# Patient Record
Sex: Male | Born: 1950 | Race: White | Hispanic: No | Marital: Married | Smoking: Former smoker
Health system: Southern US, Community
[De-identification: ages and names within clinical notes are randomized; demographics above are authoritative.]

## PROBLEM LIST (undated history)

## (undated) DIAGNOSIS — Z86010 Personal history of colonic polyps: Secondary | ICD-10-CM

## (undated) DIAGNOSIS — N3010 Interstitial cystitis (chronic) without hematuria: Secondary | ICD-10-CM

## (undated) DIAGNOSIS — K5730 Diverticulosis of large intestine without perforation or abscess without bleeding: Secondary | ICD-10-CM

## (undated) DIAGNOSIS — Z87442 Personal history of urinary calculi: Secondary | ICD-10-CM

## (undated) DIAGNOSIS — Z973 Presence of spectacles and contact lenses: Secondary | ICD-10-CM

## (undated) DIAGNOSIS — M5030 Other cervical disc degeneration, unspecified cervical region: Secondary | ICD-10-CM

## (undated) DIAGNOSIS — I77811 Abdominal aortic ectasia: Secondary | ICD-10-CM

## (undated) DIAGNOSIS — N201 Calculus of ureter: Secondary | ICD-10-CM

## (undated) DIAGNOSIS — J302 Other seasonal allergic rhinitis: Secondary | ICD-10-CM

## (undated) DIAGNOSIS — N200 Calculus of kidney: Secondary | ICD-10-CM

## (undated) DIAGNOSIS — K219 Gastro-esophageal reflux disease without esophagitis: Secondary | ICD-10-CM

## (undated) DIAGNOSIS — Z87438 Personal history of other diseases of male genital organs: Secondary | ICD-10-CM

## (undated) DIAGNOSIS — Z860101 Personal history of adenomatous and serrated colon polyps: Secondary | ICD-10-CM

## (undated) DIAGNOSIS — I10 Essential (primary) hypertension: Secondary | ICD-10-CM

## (undated) HISTORY — DX: Abdominal aortic ectasia: I77.811

## (undated) HISTORY — PX: TONSILLECTOMY: SUR1361

## (undated) HISTORY — PX: COLONOSCOPY: SHX174

## (undated) HISTORY — DX: Essential (primary) hypertension: I10

---

## 1993-05-01 HISTORY — PX: PILONIDAL CYST EXCISION: SHX744

## 2000-01-16 ENCOUNTER — Encounter (INDEPENDENT_AMBULATORY_CARE_PROVIDER_SITE_OTHER): Payer: Self-pay | Admitting: Specialist

## 2000-01-16 ENCOUNTER — Ambulatory Visit (HOSPITAL_COMMUNITY): Admission: RE | Admit: 2000-01-16 | Discharge: 2000-01-17 | Payer: Self-pay | Admitting: General Surgery

## 2000-01-16 HISTORY — PX: LAPAROSCOPIC CHOLECYSTECTOMY: SUR755

## 2000-10-24 ENCOUNTER — Encounter: Payer: Self-pay | Admitting: Internal Medicine

## 2000-10-24 ENCOUNTER — Ambulatory Visit (HOSPITAL_COMMUNITY): Admission: RE | Admit: 2000-10-24 | Discharge: 2000-10-24 | Payer: Self-pay | Admitting: Internal Medicine

## 2002-06-14 ENCOUNTER — Ambulatory Visit (HOSPITAL_COMMUNITY): Admission: RE | Admit: 2002-06-14 | Discharge: 2002-06-14 | Payer: Self-pay | Admitting: Orthopedic Surgery

## 2002-06-14 HISTORY — PX: KNEE ARTHROSCOPY: SUR90

## 2003-07-01 ENCOUNTER — Encounter: Payer: Self-pay | Admitting: Internal Medicine

## 2003-07-13 ENCOUNTER — Encounter: Admission: RE | Admit: 2003-07-13 | Discharge: 2003-07-13 | Payer: Self-pay | Admitting: Internal Medicine

## 2003-08-24 ENCOUNTER — Encounter: Admission: RE | Admit: 2003-08-24 | Discharge: 2003-08-24 | Payer: Self-pay | Admitting: Internal Medicine

## 2004-07-29 ENCOUNTER — Ambulatory Visit: Payer: Self-pay | Admitting: Internal Medicine

## 2004-08-19 ENCOUNTER — Ambulatory Visit: Payer: Self-pay | Admitting: Internal Medicine

## 2004-08-31 ENCOUNTER — Ambulatory Visit: Payer: Self-pay | Admitting: Internal Medicine

## 2005-03-16 ENCOUNTER — Ambulatory Visit: Payer: Self-pay | Admitting: Internal Medicine

## 2006-06-06 ENCOUNTER — Ambulatory Visit: Payer: Self-pay | Admitting: Internal Medicine

## 2006-06-06 LAB — CONVERTED CEMR LAB
ALT: 25 units/L (ref 0–40)
AST: 22 units/L (ref 0–37)
Albumin: 4.4 g/dL (ref 3.5–5.2)
Alkaline Phosphatase: 69 units/L (ref 39–117)
BUN: 16 mg/dL (ref 6–23)
Basophils Absolute: 0.1 10*3/uL (ref 0.0–0.1)
Basophils Relative: 1.4 % — ABNORMAL HIGH (ref 0.0–1.0)
Bilirubin, Direct: 0.1 mg/dL (ref 0.0–0.3)
CO2: 32 meq/L (ref 19–32)
Calcium: 9.6 mg/dL (ref 8.4–10.5)
Chloride: 107 meq/L (ref 96–112)
Cholesterol: 219 mg/dL (ref 0–200)
Creatinine, Ser: 1 mg/dL (ref 0.4–1.5)
Direct LDL: 140.3 mg/dL
Eosinophils Absolute: 0.4 10*3/uL (ref 0.0–0.6)
Eosinophils Relative: 5.9 % — ABNORMAL HIGH (ref 0.0–5.0)
GFR calc Af Amer: 99 mL/min
GFR calc non Af Amer: 82 mL/min
Glucose, Bld: 103 mg/dL — ABNORMAL HIGH (ref 70–99)
HCT: 43.7 % (ref 39.0–52.0)
HDL: 58.8 mg/dL (ref >39.0)
Hemoglobin: 15.2 g/dL (ref 13.0–17.0)
Hgb A1c MFr Bld: 5.4 % (ref 4.6–6.0)
Lymphocytes Relative: 20.9 % (ref 12.0–46.0)
MCHC: 34.7 g/dL (ref 30.0–36.0)
MCV: 97.8 fL (ref 78.0–100.0)
Monocytes Absolute: 0.4 10*3/uL (ref 0.2–0.7)
Monocytes Relative: 5.9 % (ref 3.0–11.0)
Neutro Abs: 5 10*3/uL (ref 1.4–7.7)
Neutrophils Relative %: 65.9 % (ref 43.0–77.0)
PSA: 0.71 ng/mL (ref 0.10–4.00)
Platelets: 215 10*3/uL (ref 150–400)
Potassium: 3.8 meq/L (ref 3.5–5.1)
RBC: 4.47 M/uL (ref 4.22–5.81)
RDW: 12.1 % (ref 11.5–14.6)
Sodium: 143 meq/L (ref 135–145)
TSH: 0.52 microintl units/mL (ref 0.35–5.50)
Total Bilirubin: 0.9 mg/dL (ref 0.3–1.2)
Total CHOL/HDL Ratio: 3.7
Total Protein: 6.8 g/dL (ref 6.0–8.3)
Triglycerides: 94 mg/dL (ref 0–149)
VLDL: 19 mg/dL (ref 0–40)
WBC: 7.5 10*3/uL (ref 4.5–10.5)

## 2006-06-18 ENCOUNTER — Encounter: Admission: RE | Admit: 2006-06-18 | Discharge: 2006-06-18 | Payer: Self-pay | Admitting: Internal Medicine

## 2008-07-24 ENCOUNTER — Ambulatory Visit: Payer: Self-pay | Admitting: Internal Medicine

## 2008-07-24 DIAGNOSIS — E785 Hyperlipidemia, unspecified: Secondary | ICD-10-CM | POA: Insufficient documentation

## 2008-07-24 DIAGNOSIS — N3010 Interstitial cystitis (chronic) without hematuria: Secondary | ICD-10-CM | POA: Insufficient documentation

## 2008-07-24 DIAGNOSIS — M5030 Other cervical disc degeneration, unspecified cervical region: Secondary | ICD-10-CM | POA: Insufficient documentation

## 2008-07-24 DIAGNOSIS — J309 Allergic rhinitis, unspecified: Secondary | ICD-10-CM | POA: Insufficient documentation

## 2008-07-24 LAB — CONVERTED CEMR LAB
Cholesterol, target level: 200 mg/dL
LDL Goal: 140 mg/dL

## 2008-07-25 ENCOUNTER — Encounter: Payer: Self-pay | Admitting: Internal Medicine

## 2008-07-27 LAB — CONVERTED CEMR LAB
ALT: 26 units/L (ref 0–53)
AST: 22 units/L (ref 0–37)
Alkaline Phosphatase: 68 units/L (ref 39–117)
Basophils Absolute: 0 10*3/uL (ref 0.0–0.1)
Basophils Relative: 1 % (ref 0–1)
Bilirubin, Direct: 0.1 mg/dL (ref 0.0–0.3)
CO2: 25 meq/L (ref 19–32)
Calcium: 9.6 mg/dL (ref 8.4–10.5)
Cholesterol: 179 mg/dL (ref 0–200)
Creatinine, Ser: 0.98 mg/dL (ref 0.40–1.50)
Eosinophils Absolute: 0.3 10*3/uL (ref 0.0–0.7)
Glucose, Bld: 92 mg/dL (ref 70–99)
MCHC: 33.1 g/dL (ref 30.0–36.0)
MCV: 94 fL (ref 78.0–100.0)
Monocytes Absolute: 0.6 10*3/uL (ref 0.1–1.0)
Monocytes Relative: 7 % (ref 3–12)
Neutro Abs: 5.3 10*3/uL (ref 1.7–7.7)
Neutrophils Relative %: 64 % (ref 43–77)
RDW: 12.5 % (ref 11.5–15.5)
Sodium: 142 meq/L (ref 135–145)
Total Bilirubin: 0.6 mg/dL (ref 0.3–1.2)
Total CHOL/HDL Ratio: 3.1

## 2008-07-28 ENCOUNTER — Encounter: Payer: Self-pay | Admitting: Internal Medicine

## 2008-07-28 DIAGNOSIS — Z87448 Personal history of other diseases of urinary system: Secondary | ICD-10-CM | POA: Insufficient documentation

## 2008-07-28 DIAGNOSIS — N390 Urinary tract infection, site not specified: Secondary | ICD-10-CM

## 2008-07-30 ENCOUNTER — Encounter (INDEPENDENT_AMBULATORY_CARE_PROVIDER_SITE_OTHER): Payer: Self-pay | Admitting: *Deleted

## 2008-07-30 ENCOUNTER — Telehealth (INDEPENDENT_AMBULATORY_CARE_PROVIDER_SITE_OTHER): Payer: Self-pay | Admitting: *Deleted

## 2008-08-06 ENCOUNTER — Encounter: Payer: Self-pay | Admitting: Internal Medicine

## 2008-09-21 ENCOUNTER — Ambulatory Visit: Payer: Self-pay | Admitting: Internal Medicine

## 2008-09-21 DIAGNOSIS — K8020 Calculus of gallbladder without cholecystitis without obstruction: Secondary | ICD-10-CM | POA: Insufficient documentation

## 2008-09-21 DIAGNOSIS — R1013 Epigastric pain: Secondary | ICD-10-CM | POA: Insufficient documentation

## 2008-09-21 LAB — CONVERTED CEMR LAB
Bilirubin Urine: NEGATIVE
Nitrite: NEGATIVE
Protein, U semiquant: NEGATIVE
Urobilinogen, UA: 0.2

## 2008-09-23 ENCOUNTER — Encounter: Admission: RE | Admit: 2008-09-23 | Discharge: 2008-09-23 | Payer: Self-pay | Admitting: Internal Medicine

## 2008-09-24 LAB — CONVERTED CEMR LAB
ALT: 29 units/L (ref 0–53)
Albumin: 4.5 g/dL (ref 3.5–5.2)
Basophils Relative: 0 % (ref 0.0–3.0)
Bilirubin, Direct: 0 mg/dL (ref 0.0–0.3)
Eosinophils Relative: 4.8 % (ref 0.0–5.0)
HCT: 42.6 % (ref 39.0–52.0)
Hemoglobin: 14.8 g/dL (ref 13.0–17.0)
Lipase: 32 units/L (ref 11.0–59.0)
Lymphs Abs: 1.5 10*3/uL (ref 0.7–4.0)
MCHC: 34.7 g/dL (ref 30.0–36.0)
MCV: 97.9 fL (ref 78.0–100.0)
Monocytes Absolute: 0.3 10*3/uL (ref 0.1–1.0)
Neutro Abs: 4.1 10*3/uL (ref 1.4–7.7)
Neutrophils Relative %: 65.6 % (ref 43.0–77.0)
RBC: 4.35 M/uL (ref 4.22–5.81)
Total Protein: 6.8 g/dL (ref 6.0–8.3)
WBC: 6.2 10*3/uL (ref 4.5–10.5)

## 2008-09-25 ENCOUNTER — Encounter (INDEPENDENT_AMBULATORY_CARE_PROVIDER_SITE_OTHER): Payer: Self-pay | Admitting: *Deleted

## 2008-09-25 ENCOUNTER — Telehealth (INDEPENDENT_AMBULATORY_CARE_PROVIDER_SITE_OTHER): Payer: Self-pay | Admitting: *Deleted

## 2008-09-29 ENCOUNTER — Ambulatory Visit: Payer: Self-pay | Admitting: Internal Medicine

## 2009-12-08 ENCOUNTER — Ambulatory Visit: Payer: Self-pay | Admitting: Internal Medicine

## 2009-12-08 DIAGNOSIS — R319 Hematuria, unspecified: Secondary | ICD-10-CM

## 2009-12-08 DIAGNOSIS — I77811 Abdominal aortic ectasia: Secondary | ICD-10-CM | POA: Insufficient documentation

## 2009-12-08 DIAGNOSIS — M25559 Pain in unspecified hip: Secondary | ICD-10-CM

## 2009-12-08 LAB — CONVERTED CEMR LAB
Ketones, urine, test strip: NEGATIVE
Nitrite: NEGATIVE
Urobilinogen, UA: 0.2

## 2009-12-09 ENCOUNTER — Ambulatory Visit: Payer: Self-pay | Admitting: Internal Medicine

## 2009-12-10 ENCOUNTER — Telehealth: Payer: Self-pay | Admitting: Internal Medicine

## 2009-12-14 ENCOUNTER — Telehealth: Payer: Self-pay | Admitting: Internal Medicine

## 2010-03-09 ENCOUNTER — Telehealth: Payer: Self-pay | Admitting: Internal Medicine

## 2010-03-10 ENCOUNTER — Telehealth (INDEPENDENT_AMBULATORY_CARE_PROVIDER_SITE_OTHER): Payer: Self-pay | Admitting: *Deleted

## 2010-05-31 NOTE — Progress Notes (Signed)
  Phone Note Other Incoming   Request: Send information Summary of Call: Patient completed a [place] medcial release form requesting copies of his records from 03-10-2005 to 03-10-2010. Patient made aware of copy service fees.  Request forwarded to [employer].

## 2010-05-31 NOTE — Progress Notes (Signed)
Summary: Rosita Fire PT STOP SMOKING  Phone Note Call from Patient   Caller: Patient Summary of Call: pt left vm that he forgot to mention at OV that he had stop smoking and would like to have this updated on his chart for insurance purposes.............Marland KitchenFelecia Deloach CMA  December 14, 2009 4:18 PM   Follow-up for Phone Call        done Follow-up by: Marga Melnick MD,  December 14, 2009 5:18 PM

## 2010-05-31 NOTE — Progress Notes (Signed)
Summary: Records request  Phone Note Call from Patient Call back at Home Phone [phone]   Summary of Call: Patient left message on triage that he would like his records for the past five years. I made him aware to come by our office or [facility] medical records and sign release. Initial call taken by: Lucious Groves CMA,  March 09, 2010 2:33 PM

## 2010-05-31 NOTE — Progress Notes (Signed)
Summary: Xray results/Request for Sleep Med  Phone Note Call from Patient Call back at Home Phone [phone]   Caller: Patient Call For: Marga Melnick MD Reason for Call: Lab or Test Results Summary of Call: Pt wants xray results Initial call taken by: Lavell Islam,  December 10, 2009 2:40 PM  Follow-up for Phone Call        Spoke with patient: No fracture; a hairline fracture may not be apparent on plain films. Orthopedic consult if no better with meds & soaking in hot bath two times a day. Hopp  Patient ok'd instruction and then mentioned he is having trouble staying asleep and would like to know if Dr.Hopper would rx something, patient ok with this being addressed on Monday Follow-up by: Shonna Chock CMA,  December 10, 2009 5:15 PM  Additional Follow-up for Phone Call Additional follow up Details #1::        Zolpidem 5 mg 1 every 3rd night as needed  #10 Additional Follow-up by: Marga Melnick MD,  December 10, 2009 5:33 PM    New/Updated Medications: ZOLPIDEM TARTRATE 5 MG TABS (ZOLPIDEM TARTRATE) 1 by mouth every 3rd night, NOT for daily use Prescriptions: ZOLPIDEM TARTRATE 5 MG TABS (ZOLPIDEM TARTRATE) 1 by mouth every 3rd night, NOT for daily use  #10 x 0   Entered by:   Shonna Chock CMA   Authorized by:   Marga Melnick MD   Signed by:   Shonna Chock CMA on 12/13/2009   Method used:   Printed then faxed to ...       [facility]  [address] [phone]* (retail)       [address]       [place], [place]  [id number]       Ph: [phone] or [phone]       Fax: [phone]   RxID:   [id number]

## 2010-05-31 NOTE — Assessment & Plan Note (Signed)
Summary: FELL AND NOW HAS HIP PAIN/KN   Vital Signs:  Patient profile:   60 year old male Weight:      167.2 pounds BMI:     23.74 Temp:     99.1 degrees F oral Pulse rate:   64 / minute Resp:     15 per minute BP sitting:   124 / 90  (left arm) Cuff size:   large  Vitals Entered By: Shonna Chock CMA (December 08, 2009 2:39 PM) CC: Patient fell x 2 ( 1st-4 weeks ago, 2nd-3 weeks ago) injured right hip, patient also thinks he passed kidney stones, Lower Extremity Joint pain   CC:  Patient fell x 2 ( 1st-4 weeks ago, 2nd-3 weeks ago) injured right hip, patient also thinks he passed kidney stones, and Lower Extremity Joint pain.  History of Present Illness: Lower Extremity Joint Pain      This is a 60 year old man who presents with Lower Extremity Joint pain 4 weeks in context of 2 falls , 4 & 3 weeks ago.  The patient reports decreased ROM( short choppy steps decreases pain), but denies swelling, redness, giving away, locking, popping, stiffness for >1 hr, and weakness.  The pain is located in the right hip.  The pain began with a fall when he slipped fly fishing 4 weeks ago, landing on hip. He  fell on hip again when  he slipped on a throw rug 1 week later.  The pain is described as dull and constant.  To date  no evaluation; Rx: Aleve w/o help.  The patient denies the following symptoms: fever, rash, photosensitivity, eye symptoms, diarrhea, and dysuria.    Allergies (verified): 1)  ! 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 Eyes:  Denies discharge, eye pain, red eye, and vision loss-both eyes. GU:  Denies discharge and hematuria; Urgency , dysuria & frequency last week until there was acute resolution  with sudden increase in flow... "same as with kidney stone" PMH of renal calculi X1. Derm:  Denies lesion(s) and rash. Heme:  Denies abnormal bruising and bleeding.  Physical Exam  General:  in no acute distress; alert,appropriate and cooperative throughout examination, but standing due to pain Eyes:  No corneal or conjunctival inflammation noted. Abdomen:  Bowel sounds positive,abdomen soft and non-tender without masses, organomegaly or hernias noted.Clinically aorta is enlarged Genitalia:  Testes bilaterally descended without nodularity, tenderness or masses. No scrotal masses or lesions. No penis lesions or urethral discharge. Msk:  No deformity or scoliosis noted of thoracic or lumbar spine.   Extremities:  No clubbing, cyanosis, edema, or deformity noted with normal full range of motion of all joints.   Slight pain to direct percussion R hip / lateral upper thigh.  Neurologic:  alert & oriented X3, strength normal in all extremities, gait normal, and DTRs symmetrical and normal.   Skin:  Intact without suspicious lesions or rashes Cervical Nodes:  No lymphadenopathy noted Axillary Nodes:  No palpable lymphadenopathy Inguinal Nodes:  No significant adenopathy Psych:  memory intact for recent and remote, normally interactive, and good eye contact.     Impression & Recommendations:  Problem # 1:  HIP PAIN, RIGHT (ICD-719.45)  post trauma X 2 ; pain X 4  weeks  His updated medication list for this problem includes:    Tramadol Hcl 50 Mg Tabs (Tramadol hcl) .Marland Kitchen... 1 every 6 hrs as needed for pain  Orders: T-Hip Comp Right Min 2 views (73510TC)  Problem # 2:  HEMATURIA (ICD-599.70) Microscopic; probable renal calculi passage last week The following medications were removed from the medication list:    Trimethoprim 100 Mg Tabs (Trimethoprim) .Marland Kitchen... 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 1 two times a day pre meals 2)  Tramadol Hcl 50 Mg Tabs (Tramadol hcl) .Marland Kitchen.. 1 every 6 hrs as needed for pain  Other Orders: Specimen Handling (84696) T-Culture, Urine (29528-41324) UA Dipstick w/o Micro (manual) (81002) Prescriptions: TRAMADOL HCL 50 MG TABS (TRAMADOL HCL) 1 every 6 hrs as needed for pain  #30 x 1   Entered and Authorized by:   Marga Melnick MD   Signed by:   Marga Melnick MD on 12/08/2009   Method used:   [facility]ed to ...       [facility]  [address] [phone]* (retail)       [address]       [place], [place]  [id number]       Ph: [phone] or [phone]       Fax: [phone]   RxID:   [id number]   Laboratory Results   Urine Tests    Routine Urinalysis   Color: lt. yellow Appearance: Clear Glucose: negative   (Normal Range: Negative) Bilirubin: negative   (Normal Range: Negative) Ketone: negative   (Normal Range: Negative) Spec. Gravity: <1.005   (Normal Range: 1.003-1.035) Blood: moderate   (Normal Range: Negative) pH: 6.5   (Normal Range: 5.0-8.0) Protein: negative   (Normal Range: Negative) Urobilinogen: 0.2   (Normal Range: 0-1) Nitrite: negative   (Normal Range: Negative) Leukocyte Esterace: moderate   (Normal Range: Negative)    Comments: sent for culture

## 2010-07-26 ENCOUNTER — Encounter: Payer: Self-pay | Admitting: Internal Medicine

## 2010-07-27 ENCOUNTER — Encounter: Payer: Self-pay | Admitting: Internal Medicine

## 2010-07-27 ENCOUNTER — Ambulatory Visit (INDEPENDENT_AMBULATORY_CARE_PROVIDER_SITE_OTHER): Payer: BC Managed Care – PPO | Admitting: Internal Medicine

## 2010-07-27 DIAGNOSIS — H1010 Acute atopic conjunctivitis, unspecified eye: Secondary | ICD-10-CM

## 2010-07-27 MED ORDER — FLUTICASONE PROPIONATE 50 MCG/ACT NA SUSP: 1.0000 | Freq: Two times a day (BID) | NASAL | Status: DC

## 2010-07-27 NOTE — Progress Notes (Signed)
  Subjective:    Patient ID: Timothy [name], male    DOB: [birth year], 60 y.o.   MRN: [id number]  HPI  He presents for assessment of cervical lymphadenopathy noted by his dentist on exam last week.  Over several months he's had intermittent recurrent symptoms. These include frontal headache and nasal congestion. He's also had intermittent itchy eyes and sneezing. He has had some green secretions from his nose but not within  the last 2 weeks.  He intermittently has  some pressure discomfort in his ears, but  no discharge.  At this time he denies facial pain, dental pain, sore throat, shortness of breath, or wheezing. He denies any productive cough.  He's been using a natural  supplement with some benefit. In the past he used a nasal spray from a health food store; this was stopped when he developed some epistaxis.    Review of Systems     Objective:   Physical Exam  He appears  well nourished and healthy.  Polypoid changes are noted in the left nare. Otic canals are normal; tympanic membranes reveal no erythema or effusion.  He has mild asymmetric cervical lymphadenopathy, greater on the left than the right.  There is a suggestion of an external  nasal crease.  Chest is clear auscultation with no wheezing or rhonchi.        Assessment & Plan:  #1 extrinsic rhinoconjunctivitis is suggested; pathologic cervical lymphadenopathy is not suggested.  Plan: Neti pot  daily or twice daily would be recommended on an as needed basis. Additionally over-the-counter loratadine 10 mg daily as needed   Generic Flonase one spray twice a day as needed is also an option.

## 2010-07-27 NOTE — Patient Instructions (Signed)
Use the Neti pot daily to twice a day as needed for congestion, go from the open side to closed side. Use the generic Flonase one spray twice a day as needed, using the crossover technique discussed.

## 2010-08-15 ENCOUNTER — Ambulatory Visit (INDEPENDENT_AMBULATORY_CARE_PROVIDER_SITE_OTHER): Payer: BC Managed Care – PPO | Admitting: Internal Medicine

## 2010-08-15 ENCOUNTER — Encounter: Payer: Self-pay | Admitting: Internal Medicine

## 2010-08-15 VITALS — BP 138/90 | HR 80 | Temp 98.2°F | Wt 176.6 lb

## 2010-08-15 DIAGNOSIS — R1013 Epigastric pain: Secondary | ICD-10-CM

## 2010-08-15 DIAGNOSIS — T50905A Adverse effect of unspecified drugs, medicaments and biological substances, initial encounter: Secondary | ICD-10-CM

## 2010-08-15 DIAGNOSIS — T50995A Adverse effect of other drugs, medicaments and biological substances, initial encounter: Secondary | ICD-10-CM | POA: Insufficient documentation

## 2010-08-15 DIAGNOSIS — Z888 Allergy status to other drugs, medicaments and biological substances status: Secondary | ICD-10-CM

## 2010-08-15 LAB — CBC WITH DIFFERENTIAL/PLATELET
Basophils Absolute: 0.1 10*3/uL (ref 0.0–0.1)
Eosinophils Absolute: 0.3 10*3/uL (ref 0.0–0.7)
Hemoglobin: 13.9 g/dL (ref 13.0–17.0)
Lymphocytes Relative: 14.4 % (ref 12.0–46.0)
MCHC: 34 g/dL (ref 30.0–36.0)
MCV: 95.9 fl (ref 78.0–100.0)
Monocytes Absolute: 0.6 10*3/uL (ref 0.1–1.0)
Neutro Abs: 6 10*3/uL (ref 1.4–7.7)
Neutrophils Relative %: 74.2 % (ref 43.0–77.0)
RDW: 12.9 % (ref 11.5–14.6)

## 2010-08-15 LAB — AMYLASE: Amylase: 60 U/L (ref 27–131)

## 2010-08-15 LAB — HEPATIC FUNCTION PANEL
Albumin: 4 g/dL (ref 3.5–5.2)
Alkaline Phosphatase: 93 U/L (ref 39–117)

## 2010-08-15 MED ORDER — TRAMADOL HCL 50 MG PO TABS: 50.0000 mg | ORAL_TABLET | Freq: Four times a day (QID) | ORAL | Status: DC | PRN

## 2010-08-15 NOTE — Progress Notes (Signed)
  Subjective:    Patient ID: Timothy [name], male    DOB: 09-27-1950, 60 y.o.   MRN: [id number]  HPI ABDOMINAL PAIN  Location: RUQ Onset: 2 weeks ago, intermittently since   Radiation: somewhat to R lateral abdomen Severity: up to a 10 Quality: sharp  Duration: hours Better with:heating pad, direct pressure RUQ  &  Aleve; Pepto Bismol  & TUMS did not. He is on ranitidine 150 mg daily.  Worse with: waist flexion or lifting; large meals   Symptoms Nausea/Vomiting: no  Diarrhea: yes, loose last week  Constipation: yes, for 2 days after loose stool  Melena/BRBPR: no  Hematemesis: no  Anorexia: no  Fever/Chills: no  Dysuria: no  Rash: no  Wt loss: no  EtOH use: no  NSAIDs/ASA: yes, Aleve 2 daily on average, but up to 4/ day   Past Surgeries: Gall bladder resection  ; Diverticulosis @colonoscopy  2005. His mother had colitis, ? Ulcerative      Review of Systems no pyuria or hematuria; increased "burping " recently     Objective:   Physical Exam he is in no acute distress.  Pupils were equal round and reactive to light. There is suggestion of a subtle arcus senilis.  He has no scleral icterus  Oropharynx reveals good dental hygiene. There is minimal erythema of the posterior pharynx but no exudates.  Chest is clear to auscultation with no rales, rhonchi, or wheezing.  There's no tenderness over the sternum or inferior rib cage. Also the xiphoid is nontender.  He exhibits an S4 with slight slurring. There is no significant murmurs.  His aorta is palpable; clinically there is no evidence of an aneurysm. He is tender to palpation over the aorta but not over the right upper quadrant. There is no tenderness of upper quadrant even to percussion.  Skin is warm and dry. He has no rashes or other lesions. There is no jaundice.  All pulses are intact.  He has no significant edema, clubbing, or cyanosis.  He is oriented and interactive.          Assessment & Plan:  #1  abdominal pain  #2 past history of diverticulosis  #3 tenderness to palpation of the aorta without clinical aneurysm  #4 past medical history of cholecystectomy for gallstones  Plan: Labs will be ordered along with an ultrasound of the  abdomen. He'll be asked to complete stool cards. Increase ranitidine to twice a day before breakfast and evening meal. Tramadol will be ordered in place of the Aleve as the nonsteroidal could  induce gastritis.

## 2010-08-15 NOTE — Patient Instructions (Signed)
Complete stool cards; stop Aleve.

## 2010-08-17 ENCOUNTER — Ambulatory Visit
Admission: RE | Admit: 2010-08-17 | Discharge: 2010-08-17 | Disposition: A | Discharge status: Home / Self Care | Payer: BC Managed Care – PPO | Source: Ambulatory Visit | Attending: Internal Medicine | Admitting: Internal Medicine

## 2010-08-17 DIAGNOSIS — R1013 Epigastric pain: Secondary | ICD-10-CM

## 2010-08-18 ENCOUNTER — Other Ambulatory Visit: Payer: Self-pay | Admitting: Internal Medicine

## 2010-08-18 DIAGNOSIS — N200 Calculus of kidney: Secondary | ICD-10-CM

## 2010-08-26 ENCOUNTER — Other Ambulatory Visit: Payer: Self-pay | Admitting: *Deleted

## 2010-08-26 DIAGNOSIS — R1013 Epigastric pain: Secondary | ICD-10-CM

## 2010-08-26 MED ORDER — TRAMADOL HCL 50 MG PO TABS: 50.0000 mg | ORAL_TABLET | Freq: Four times a day (QID) | ORAL | Status: DC | PRN

## 2010-09-01 ENCOUNTER — Other Ambulatory Visit (INDEPENDENT_AMBULATORY_CARE_PROVIDER_SITE_OTHER): Payer: BC Managed Care – PPO

## 2010-09-01 ENCOUNTER — Ambulatory Visit (HOSPITAL_COMMUNITY): Admission: RE | Admit: 2010-09-01 | Payer: BC Managed Care – PPO | Source: Ambulatory Visit | Admitting: Urology

## 2010-09-01 DIAGNOSIS — Z1211 Encounter for screening for malignant neoplasm of colon: Secondary | ICD-10-CM

## 2010-09-01 LAB — HEMOCCULT GUIAC POC 1CARD (OFFICE)
Card #3 Fecal Occult Blood, POC: NEGATIVE
Fecal Occult Blood, POC: NEGATIVE

## 2010-09-16 NOTE — Op Note (Signed)
NAME:  Timothy [name], Timothy [name]                         ACCOUNT NO.:  [id number]   MEDICAL RECORD NO.:  [id number]                   PATIENT TYPE:  AMB   LOCATION:  DAY                                  FACILITY:  [facility]   PHYSICIAN:  Marlowe Kays, M.D.               DATE OF BIRTH:  1950/12/11   DATE OF PROCEDURE:  06/14/2002  DATE OF DISCHARGE:                                 OPERATIVE REPORT   PREOPERATIVE DIAGNOSES:  Bucket-handle tear of medial meniscus.   POSTOPERATIVE DIAGNOSES:  1. Double bucket-handle tear of the medial meniscus.  2. Torn lateral meniscus.  3. 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   OPERATION:  Right knee arthroscopy with 1) partial medial lateral  meniscectomy, 2) debridement of facet patella.   SURGEON:  Marlowe Kays, M.D.   ASSISTANT:  Nurse.   ANESTHESIA:  General.   PATHOLOGY AND JUSTIFICATION FOR PROCEDURE:  He has had a history of popping  and feeling that the knee is dislocated for several most now, no definite  known injury. MRI has demonstrated a bucket-handle tear of the medial  meniscus. Accordingly he is here today for the above mentioned surgery. See  operative description below.   DESCRIPTION OF PROCEDURE:  Satisfactory general anesthesia, pneumatic  tourniquet, thigh stabilizer, right knee was prepped and draped in a sterile  field, superomedial saline inflow. First through an anterior lateral portal,  the medial compartment of the knee joint was evaluated. The major bucket-  handle tear was found in __________ in the joint with some minimal wear of  the medial femoral condyle. With scissors, I cut the bucket-handle  attachment in the mid medial meniscus and through an accessory portal to the  patellar tendon. I introduced scissors and with pituitary holding on to the  cut end of the bucket-handle through the original portal, I sectioned the  posterior attachment of the meniscus in the intercondylar area and then was  able to remove the fragment which I saved for the patient. I gently smoothed  down the medial femoral condyle with a 3.5 shaver as well as performing a  modest synovectomy anteriorly. I then looked posteriorly and found a second  small bucket-handle fragment over the remaining medial meniscus and I  sectioned this out with small baskets and also removed a remnant of the  intercondylar attachment and then shaved everything down with a 3.5 shaver  with final pictures being taken. I then looked up in the medial gutter and  suprapatellar area finding the wear of the lateral facet of the patella  which was better handled through reverse portals. I then reversed portals,  his ACL was intact, a little bit of synovitis, anterolaterally I resected  for better visualization. I was able to shave down the lateral facet of the  patella until smooth. His lateral meniscus had at least three areas of  moderate tear with one in the  intercondylar area, one just prior to the  posterior curve and one at the mid third. All of this was pictured and  shaved down until smooth with a 3.5 shaver. The knee joint was then  irrigated until clear and all fluid possible removed. The three anterior  portals were closed with 4-0 nylon, 20 mL of 0.5% Marcaine with adrenaline  was instilled through an in flap rasp which  was removed and this portal closed with 4-0 nylon as well. Betadine Adaptic  dry sterile dressing were applied, tourniquet was released, tolerated the  procedure well and was taken to the recovery room in satisfactory condition  with no known complications.                                               Marlowe Kays, M.D.    JA/MEDQ  D:  06/14/2002  T:  06/14/2002  Job:  [id number]

## 2010-09-16 NOTE — Op Note (Signed)
[place]  Patient:    Timothy [name], Timothy [name]                      MRN: [id number] Proc. Date: 01/16/00 Adm. Date:  [id number] Disc. Date: [id number] Attending:  Tempie Donning CC:         Titus Dubin. Alwyn Ren, M.D. [place]   Operative Report  PREOPERATIVE DIAGNOSIS:  Cholecystitis, cholelithiasis.  POSTOPERATIVE DIAGNOSIS:  Cholecystitis, Cholelithiasis.  OPERATION PERFORMED:  Laparoscopic cholecystectomy.  SURGEON:  Gita Kudo, M.D.  ANESTHESIA:  General endotracheal.  INDICATIONS FOR PROCEDURE:  The patient is a 60 year old male with bouts of abdominal pain showing an ultrasound with gallstones.  Liver function studies were normal.  His general health is excellent and physical exam is negative otherwise.  OPERATIVE FINDINGS:  The patients gallbladder was unusual in that it was quite large and very thick.  It was adherent to the falciform ligament in the midline.  The cystic duct and artery were each small and normal in anatomy and size.  DESCRIPTION OF PROCEDURE:  Under satisfactory general endotracheal anesthesia, the patients abdomen was prepped and draped in a standard fashion.  A transverse incision was made above the umbilicus and the midline opened into the peritoneum, controlled with a figure-of-eight 0 Vicryl suture and operating Hasson port inserted and secured.  Good CO2 pneumoperitoneum establisehd and camera placed.  Then under direct vision, two #5 ports were placed laterally and a second #10 port medially.  Operating through the medial port, I dissected the gallbladder away from the falciform ligament using the J-hook for coagulation and dissection.  Following this, dissectors were used to carefully identify the cystic duct gallbladder junction and circumferentially dissect it using right angle clamps.  When we were certain of the anatomy, the duct was controlled with multiple metal clips and divided between the distal two.   The cystic artery was likewise dissected clipped and cut.  Another posterior branch of the artery was identified prior and likewise clipped and cut.  Then the gallbladder was dissected from the liver bed using the coagulating spatula for hemostasis and dissection.  The liver bed was made dry by cautery and the operative site lavaged with saline and suctioned.  Following this, the camera was moved to the upper port and the grasper placed through the umbilical port and the gallbladder removed.  Because it was so thick and large, it was opened externally and multiple small stones were removed.  The fascia and skin incisions were extended and the gallbladder extracted without spillage or complication.  It was noted to be quite thick and had many small stones.  Following this, the sites were infiltrated with Marcaine for postoperative analgesia and the midline closed with two figure-of-eight 0 Vicryl suture and subcutaneous in all areas with 4-0 Vicryl and then Steri-Strips for skin.  The sponge and needle counts were correct. There were no complications and he went to the recovery room from the operating room in good condition. DD:  01/16/00 TD:  01/17/00 Job: [id number]

## 2010-10-28 IMAGING — CR DG HIP COMPLETE 2+V*R*
3 series · 3 of 3 positions shown · non-contrast
Comparison: None.

CLINICAL DATA: Status post fall 2 weeks ago.  Pain with
weightbearing.

RIGHT HIP - COMPLETE 2+ VIEW

[view not recorded (1 of 3)]
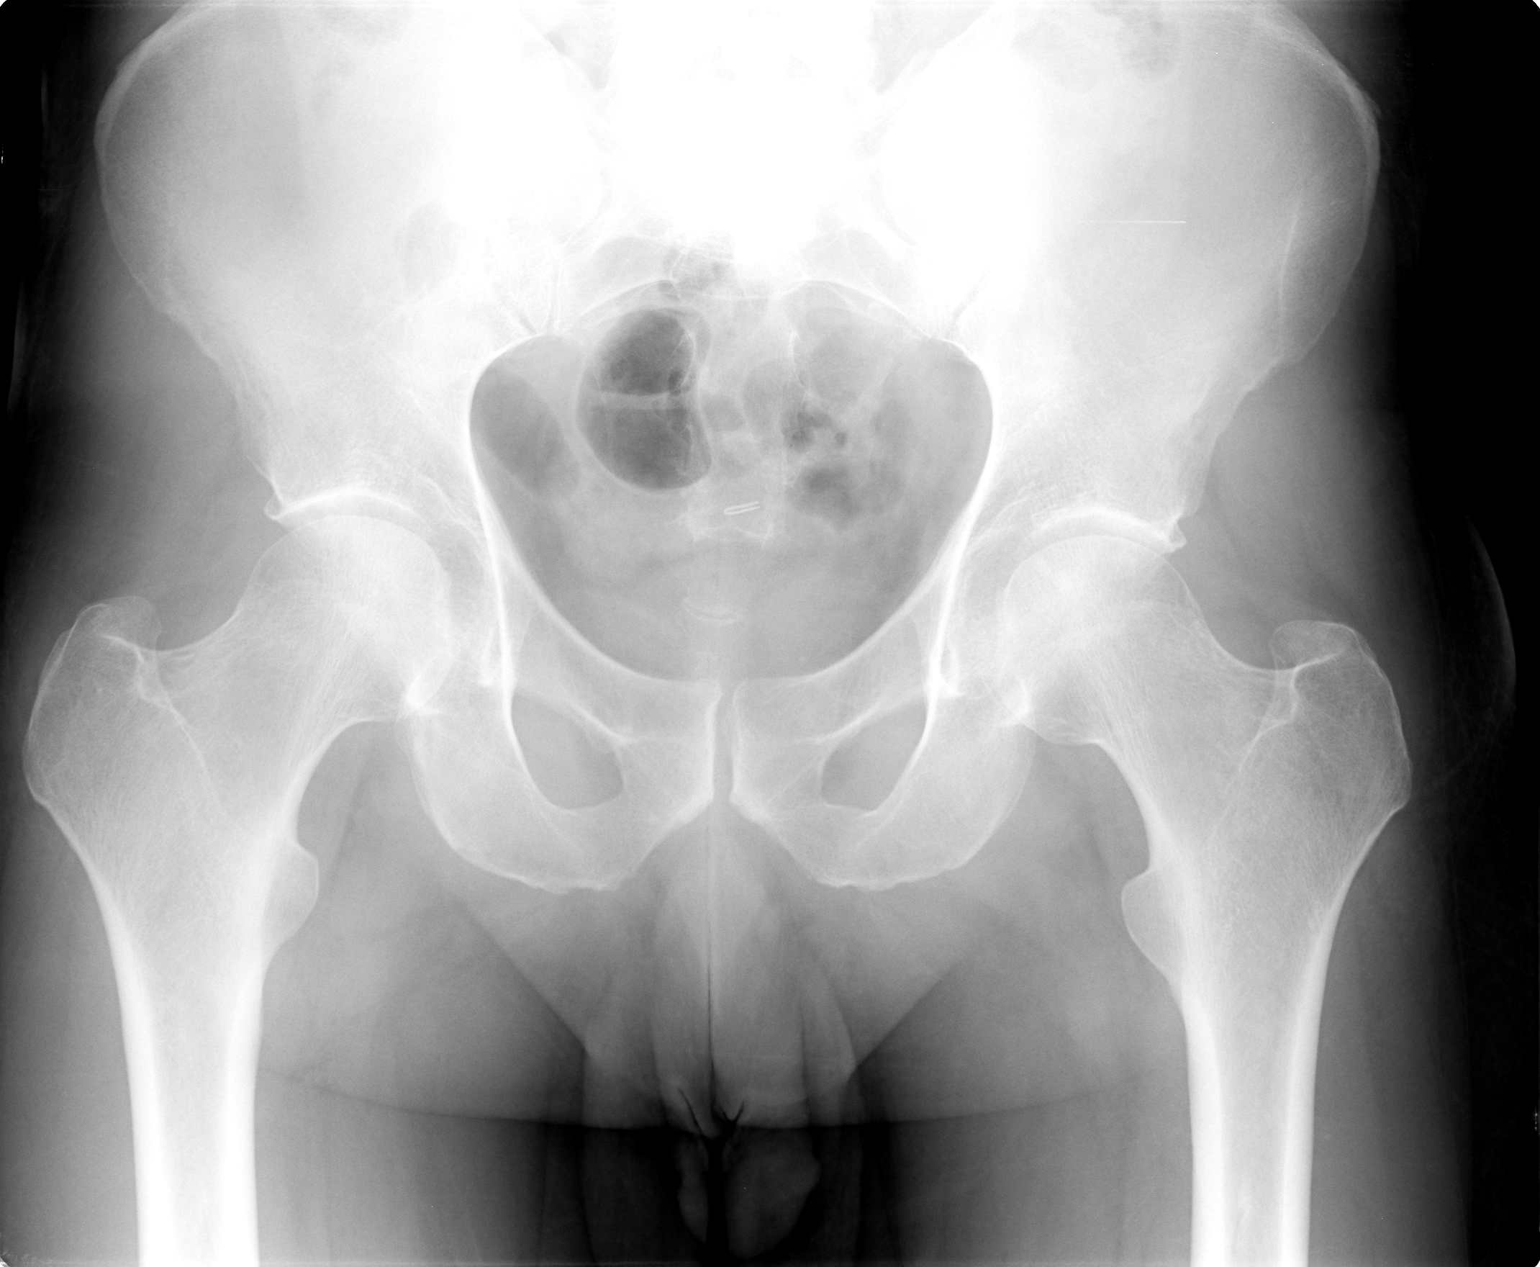

[view not recorded (2 of 3)]
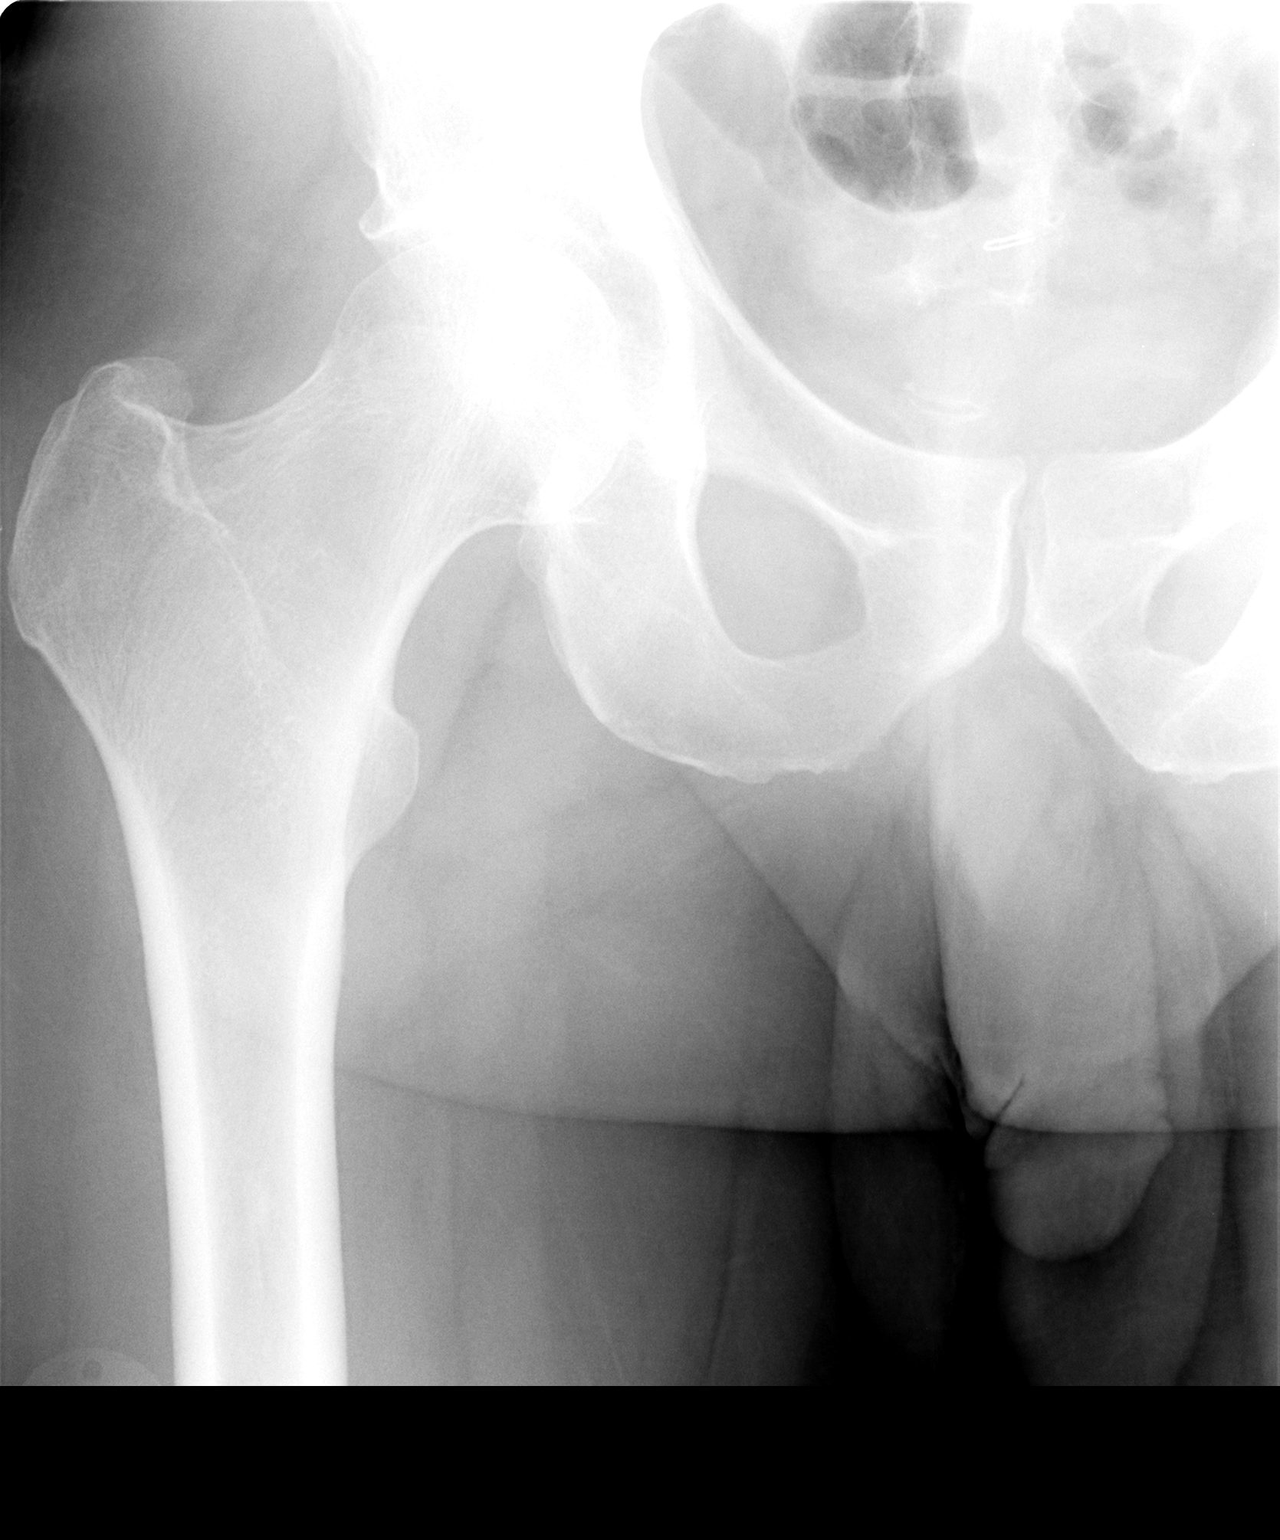

[view not recorded (3 of 3)]
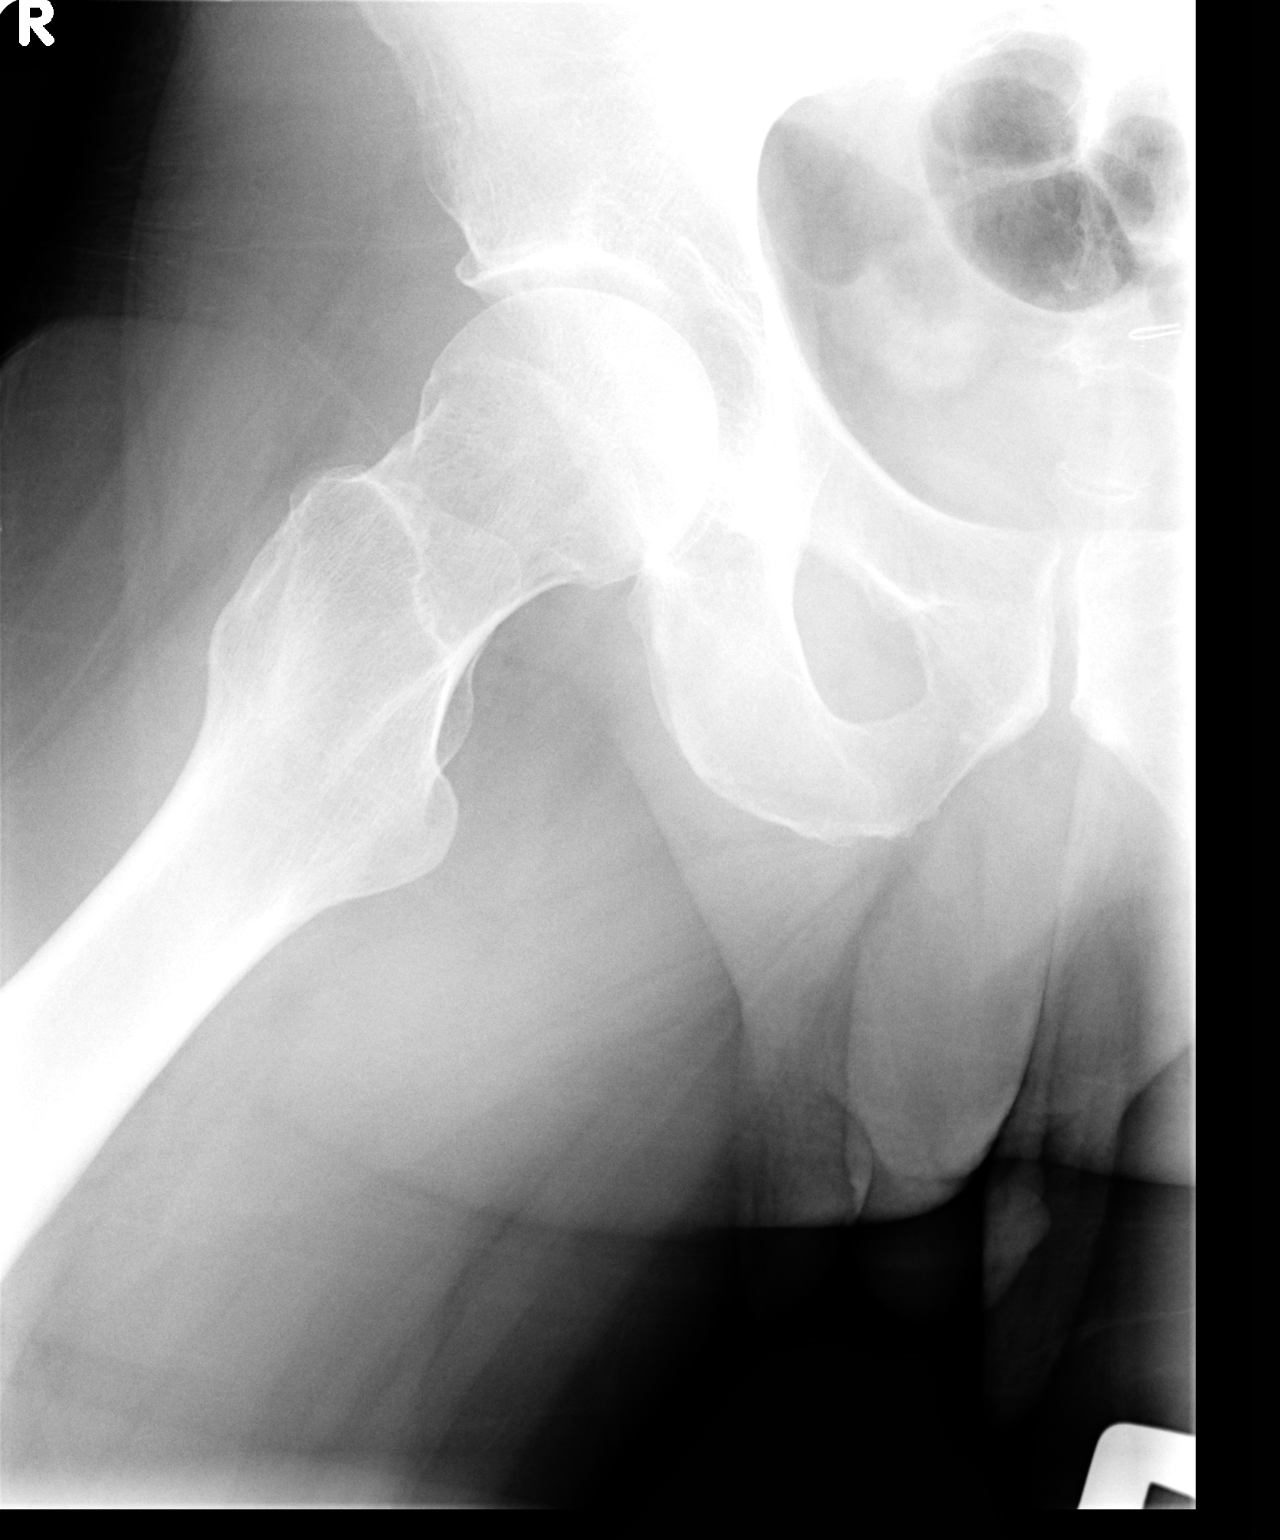

[3 of 3 positions shown; findings below may reference images not displayed]

FINDINGS: No fracture or dislocation.
IMPRESSION: No fracture.

## 2010-11-23 ENCOUNTER — Telehealth: Payer: Self-pay | Admitting: Internal Medicine

## 2010-11-23 DIAGNOSIS — I714 Abdominal aortic aneurysm, without rupture: Secondary | ICD-10-CM

## 2010-11-23 NOTE — Telephone Encounter (Signed)
error 

## 2010-11-23 NOTE — Telephone Encounter (Signed)
Patient received a note from dr hopper - (see lab [phone]) dr hopper recomed  cardiovascular a

## 2010-12-15 ENCOUNTER — Encounter: Payer: Self-pay | Admitting: Cardiology

## 2010-12-15 ENCOUNTER — Ambulatory Visit (INDEPENDENT_AMBULATORY_CARE_PROVIDER_SITE_OTHER): Payer: BC Managed Care – PPO | Admitting: Cardiology

## 2010-12-15 DIAGNOSIS — R030 Elevated blood-pressure reading, without diagnosis of hypertension: Secondary | ICD-10-CM

## 2010-12-15 DIAGNOSIS — I714 Abdominal aortic aneurysm, without rupture, unspecified: Secondary | ICD-10-CM | POA: Insufficient documentation

## 2010-12-15 DIAGNOSIS — E785 Hyperlipidemia, unspecified: Secondary | ICD-10-CM

## 2010-12-15 DIAGNOSIS — I517 Cardiomegaly: Secondary | ICD-10-CM

## 2010-12-15 NOTE — Assessment & Plan Note (Signed)
Patient's blood pressure is elevated in the office today. I have asked him to check this at home. He will keep records and if hypertension demonstrated we will add medications particularly with his recently diagnosed small aneurysm.

## 2010-12-15 NOTE — Assessment & Plan Note (Signed)
Plan repeat abdominal ultrasound April 2013.

## 2010-12-15 NOTE — Patient Instructions (Signed)
Your physician recommends that you schedule a follow-up appointment in: 6 WEEKS  Your physician has requested that you have an echocardiogram. Echocardiography is a painless test that uses sound waves to create images of your heart. It provides your doctor with information about the size and shape of your heart and how well your heart's chambers and valves are working. This procedure takes approximately one hour. There are no restrictions for this procedure.   START ASPIRIN 81MG  ONCE DAILY

## 2010-12-15 NOTE — Progress Notes (Signed)
HPI: 60 year old male for evaluation of abdominal aortic aneurysm. No prior cardiac history. He typically does not have dyspnea on exertion, orthopnea, PND, pedal edema, palpitations, syncope or chest pain. He had an abdominal ultrasound in April 2012 that revealed  a focal bulge in the distal abdominal aorta measuring 3 cm in greatest diameter. This is a probable early distal aortic aneurysm. Cardiology therefore asked to evaluate.  Current Outpatient Prescriptions  Medication Sig Dispense Refill  . Doxylamine Succinate, Sleep, (SLEEP AID PO) Take by mouth at bedtime.        . naproxen (NAPROSYN) 500 MG tablet Take 500 mg by mouth 2 (two) times daily with a meal.        . ranitidine (ZANTAC) 150 MG tablet Take 150 mg by mouth 2 (two) times daily.          Allergies  Allergen Reactions  . Doxycycline Hives  . Dye Fdc Blue (Brilliant Blue Fcf (Fd&C Blue #1))   . Penicillins Hives  . Sulfa Antibiotics Rash    2006    Past Medical History  Diagnosis Date  . Diverticulosis   . Seasonal allergies   . Elevated PSA     Dr Vonita Moss    Past Surgical History  Procedure Date  . Cholecystectomy 2001    Dr. Maryagnes Amos  . Cystectomy     Pilonidal Cystectomy  . Knee arthroscopy 2001  . Cystoscopy 1999  . Colonoscopy 2005    Tics  . Tonsillectomy     History   Social History  . Marital Status: Married    Spouse Name: N/A    Number of Children: N/A  . Years of Education: N/A   Occupational History  .      Art conservation   Social History Main Topics  . Smoking status: Former Games developer  . Smokeless tobacco: Not on file  . Alcohol Use: Yes     2-3 beers/week  . Drug Use: No  . Sexually Active: Not on file   Other Topics Concern  . Not on file   Social History Narrative  . No narrative on file    Family History  Problem Relation Age of Onset  . Aneurysm Father 51    CNS aneursym  . Heart failure Mother   . Colitis Mother   . Cancer Other     ; primary unknown    ROS:  no fevers or chills, productive cough, hemoptysis, dysphasia, odynophagia, melena, hematochezia, dysuria, hematuria, rash, seizure activity, orthopnea, PND, pedal edema, claudication. Remaining systems are negative.  Physical Exam: General:  Well developed/well nourished in NAD Skin warm/dry Patient not depressed No peripheral clubbing Back-normal HEENT-normal/normal eyelids Neck supple/normal carotid upstroke bilaterally; no bruits; no JVD; no thyromegaly chest - CTA/ normal expansion CV - RRR/normal S1 and S2; no murmurs, rubs or gallops;  PMI nondisplaced Abdomen -NT/ND, no HSM, no mass, + bowel sounds, no bruit; possible small pulsatile mass. 2+ femoral pulses, no bruits Ext-no edema, chords, 2+ DP Neuro-grossly nonfocal  ECG NSR with LVH

## 2010-12-15 NOTE — Assessment & Plan Note (Signed)
Management per primary care. 

## 2010-12-15 NOTE — Assessment & Plan Note (Signed)
Will schedule echocardiogram to assess LV function and wall thickness. We'll also rule out proximal thoracic aortic aneurysm.

## 2010-12-16 ENCOUNTER — Encounter: Payer: Self-pay | Admitting: *Deleted

## 2010-12-20 ENCOUNTER — Ambulatory Visit (HOSPITAL_COMMUNITY): Payer: BC Managed Care – PPO | Attending: Internal Medicine | Admitting: Radiology

## 2010-12-20 DIAGNOSIS — I10 Essential (primary) hypertension: Secondary | ICD-10-CM | POA: Insufficient documentation

## 2010-12-20 DIAGNOSIS — I714 Abdominal aortic aneurysm, without rupture, unspecified: Secondary | ICD-10-CM

## 2010-12-20 DIAGNOSIS — E785 Hyperlipidemia, unspecified: Secondary | ICD-10-CM | POA: Insufficient documentation

## 2010-12-20 DIAGNOSIS — I517 Cardiomegaly: Secondary | ICD-10-CM

## 2010-12-20 HISTORY — PX: TRANSTHORACIC ECHOCARDIOGRAM: SHX275

## 2011-01-13 ENCOUNTER — Encounter: Payer: BC Managed Care – PPO | Admitting: Internal Medicine

## 2011-01-23 ENCOUNTER — Encounter: Payer: Self-pay | Admitting: Cardiology

## 2011-01-23 ENCOUNTER — Ambulatory Visit (INDEPENDENT_AMBULATORY_CARE_PROVIDER_SITE_OTHER): Payer: BC Managed Care – PPO | Admitting: Cardiology

## 2011-01-23 VITALS — BP 156/100 | HR 76 | Ht 70.0 in | Wt 179.0 lb

## 2011-01-23 DIAGNOSIS — I10 Essential (primary) hypertension: Secondary | ICD-10-CM | POA: Insufficient documentation

## 2011-01-23 DIAGNOSIS — I714 Abdominal aortic aneurysm, without rupture, unspecified: Secondary | ICD-10-CM

## 2011-01-23 DIAGNOSIS — E785 Hyperlipidemia, unspecified: Secondary | ICD-10-CM

## 2011-01-23 MED ORDER — LISINOPRIL 10 MG PO TABS: 10.0000 mg | ORAL_TABLET | Freq: Every day | ORAL | Status: DC

## 2011-01-23 NOTE — Assessment & Plan Note (Signed)
Blood pressure is mildly elevated. Begin lisinopril 10 mg daily. Check potassium and renal function in one week.

## 2011-01-23 NOTE — Patient Instructions (Signed)
Your physician wants you to follow-up in: 6 MONTHS You will receive a reminder letter in the mail two months in advance. If you don't receive a letter, please call our office to schedule the follow-up appointment.   START LISINOPRIL 10 MG ONCE DAILY  Your physician recommends that you return for lab work in: ONE WEEK

## 2011-01-23 NOTE — Progress Notes (Signed)
HPI:60 year old male I saw in August of 2012 for evaluation of abdominal aortic aneurysm.  He had an abdominal ultrasound in April 2012 that revealed a focal bulge in the distal abdominal aorta measuring 3 cm in greatest diameter. This is a probable early distal aortic aneurysm. Echo in August of 2012 showed normal LV function and no thoracic aneurysm. Since he was last seen he denies dyspnea, chest pain, palpitations or syncope. He did bring records of his home blood pressure. His systolic typically runs 140-145. The diastolic appears to be 90.   Current Outpatient Prescriptions  Medication Sig Dispense Refill  . aspirin EC 81 MG tablet Take 1 tablet (81 mg total) by mouth daily.      . Doxylamine Succinate, Sleep, (SLEEP AID PO) Take by mouth at bedtime.        . naproxen (NAPROSYN) 500 MG tablet Take 500 mg by mouth 2 (two) times daily with a meal.        . ranitidine (ZANTAC) 150 MG tablet Take 150 mg by mouth 2 (two) times daily.           Past Medical History  Diagnosis Date  . Diverticulosis   . Seasonal allergies   . Elevated PSA     Dr Vonita Moss  . Hypertension   . AAA (abdominal aortic aneurysm)     Past Surgical History  Procedure Date  . Cholecystectomy 2001    Dr. Maryagnes Amos  . Cystectomy     Pilonidal Cystectomy  . Knee arthroscopy 2001  . Cystoscopy 1999  . Colonoscopy 2005    Tics  . Tonsillectomy     History   Social History  . Marital Status: Married    Spouse Name: N/A    Number of Children: N/A  . Years of Education: N/A   Occupational History  .      Art conservation   Social History Main Topics  . Smoking status: Former Games developer  . Smokeless tobacco: Not on file  . Alcohol Use: Yes     2-3 beers/week  . Drug Use: No  . Sexually Active: Not on file   Other Topics Concern  . Not on file   Social History Narrative  . No narrative on file    ROS: no fevers or chills, productive cough, hemoptysis, dysphasia, odynophagia, melena, hematochezia,  dysuria, hematuria, rash, seizure activity, orthopnea, PND, pedal edema, claudication. Remaining systems are negative.  Physical Exam: Well-developed well-nourished in no acute distress.  Skin is warm and dry.  HEENT is normal.  Neck is supple. No thyromegaly.  Chest is clear to auscultation with normal expansion.  Cardiovascular exam is regular rate and rhythm.  Abdominal exam nontender or distended. No masses palpated. Extremities show no edema. neuro grossly intact

## 2011-01-23 NOTE — Assessment & Plan Note (Signed)
Repeat abdominal ultrasound April 2013.

## 2011-01-23 NOTE — Assessment & Plan Note (Signed)
Management per primary care. 

## 2011-01-31 ENCOUNTER — Other Ambulatory Visit: Payer: BC Managed Care – PPO | Admitting: *Deleted

## 2011-07-06 IMAGING — US US ABDOMEN COMPLETE
1 series · 14 of 25 positions shown · non-contrast
Comparison: 09/23/2008

CLINICAL DATA: Epigastric/abdominal pain

COMPLETE ABDOMINAL ULTRASOUND

[Series 1: us abdomen complete · 14 of 88 slices shown]
[im 1/88]
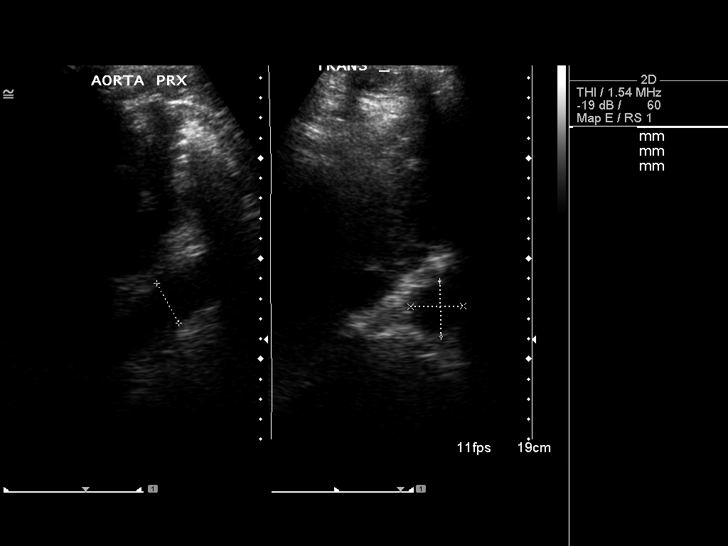
[im 8/88]
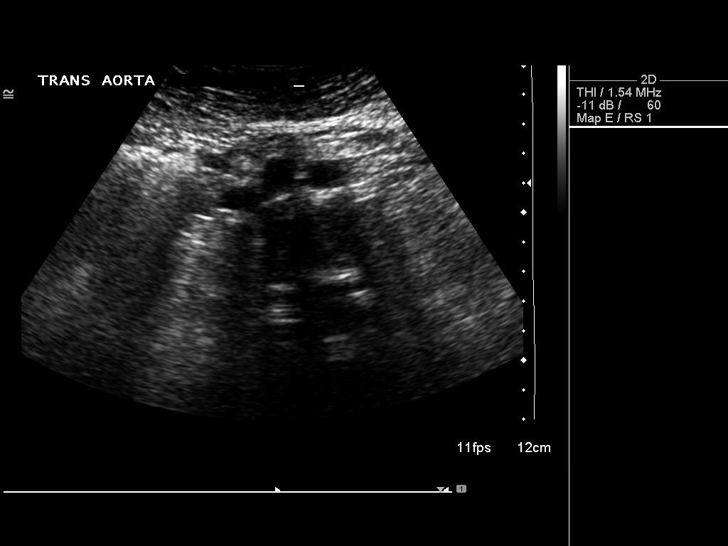
[im 15/88]
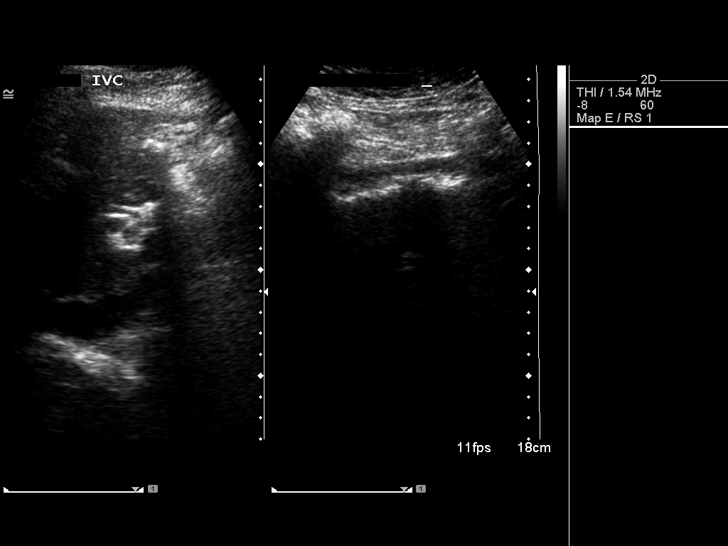
[im 22/88]
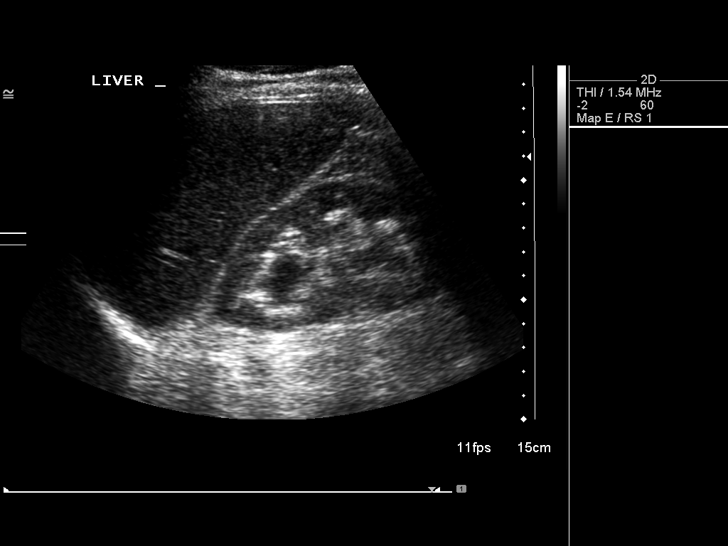
[im 30/88]
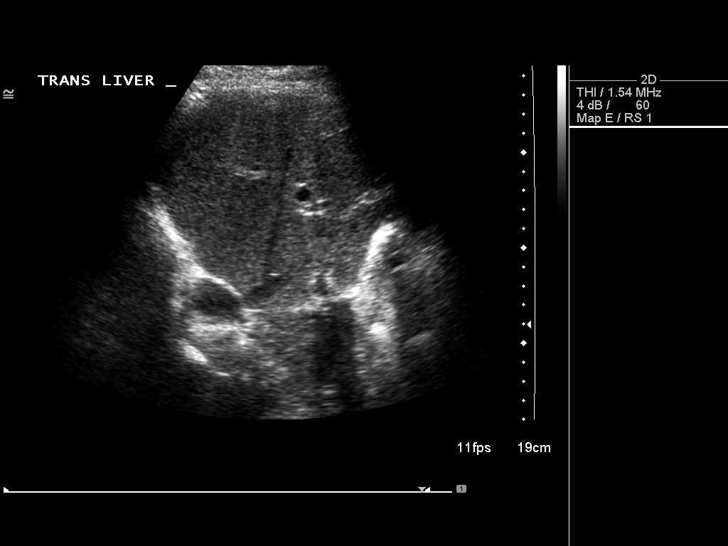
[im 33/88]
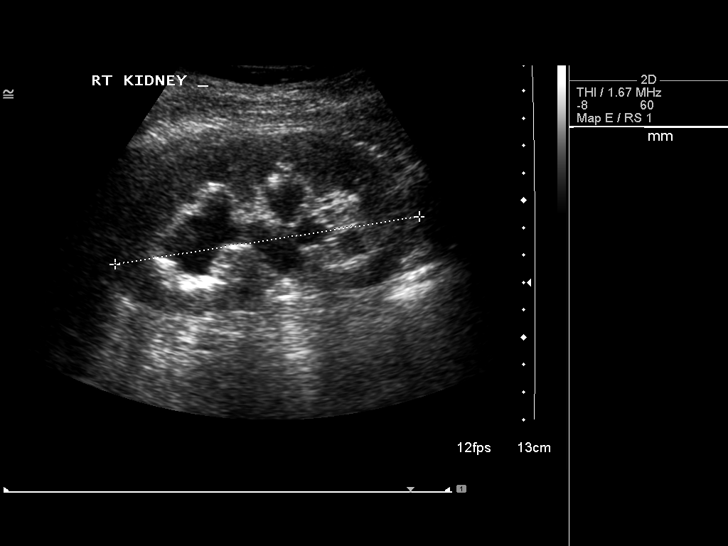
[im 40/88]
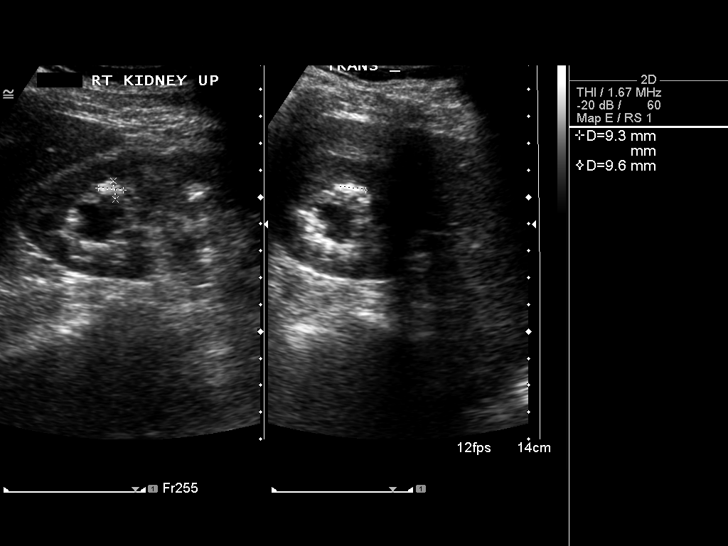
[im 48/88]
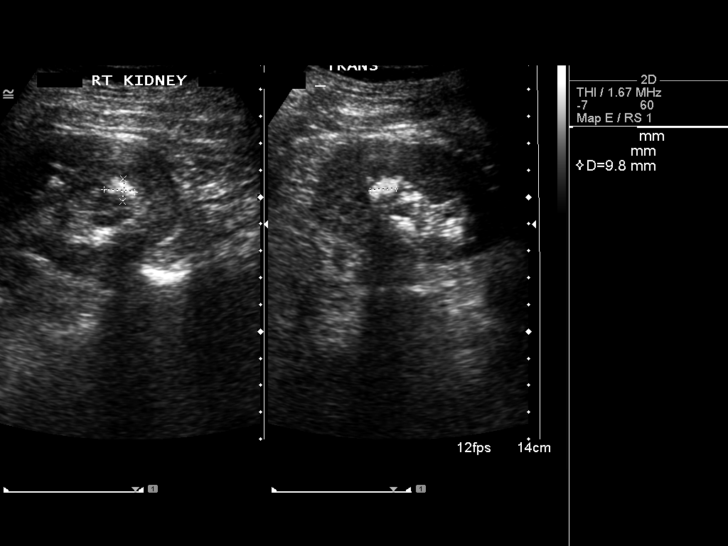
[im 55/88]
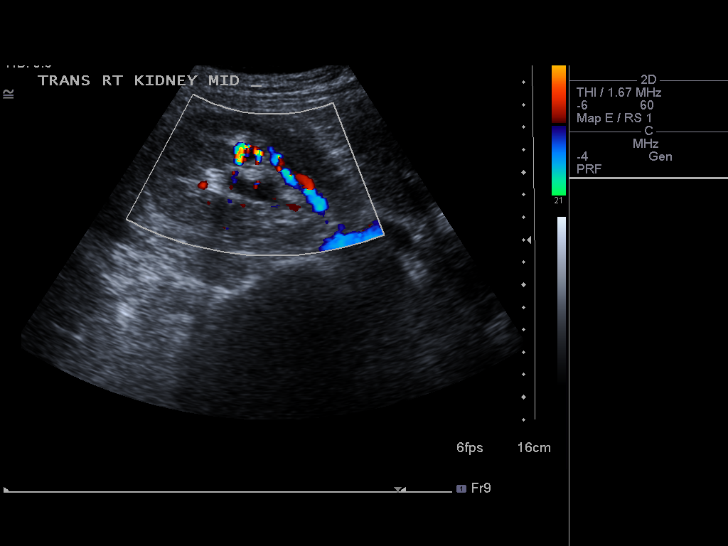
[im 59/88]
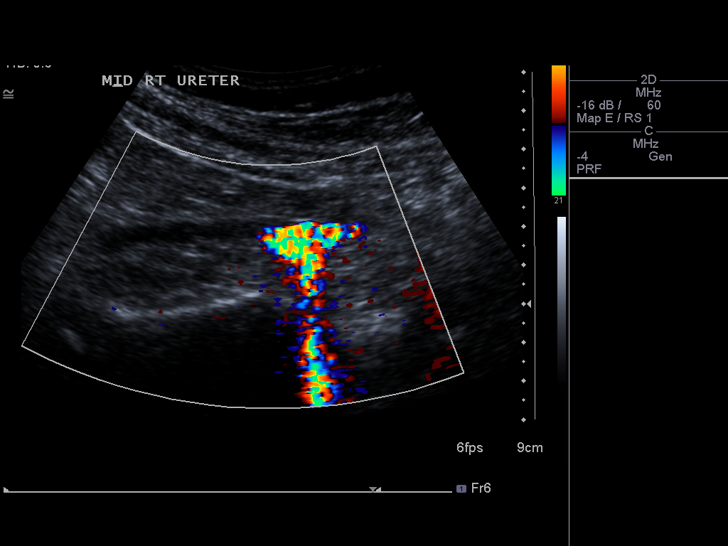
[im 66/88]
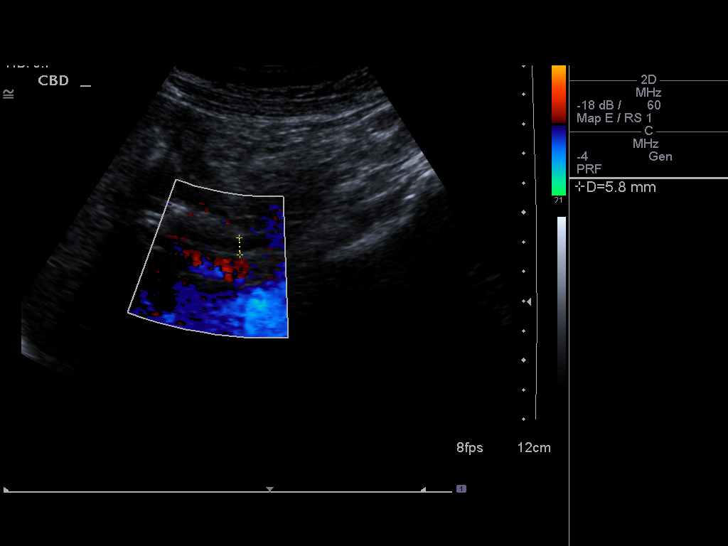
[im 73/88]
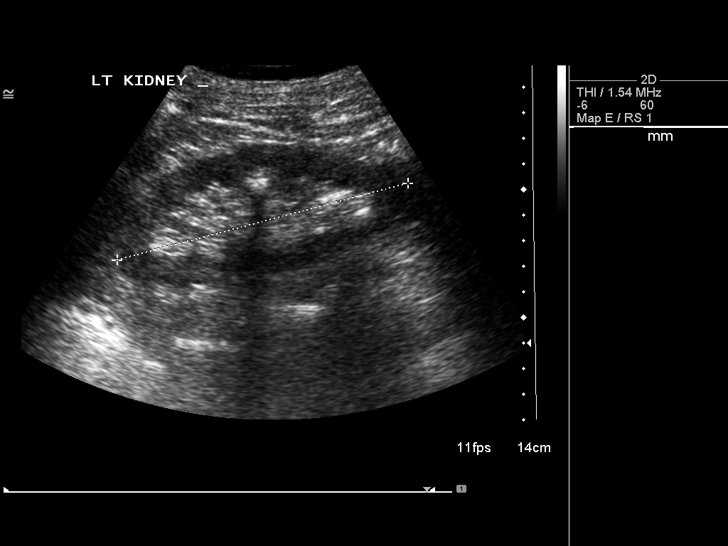
[im 80/88]
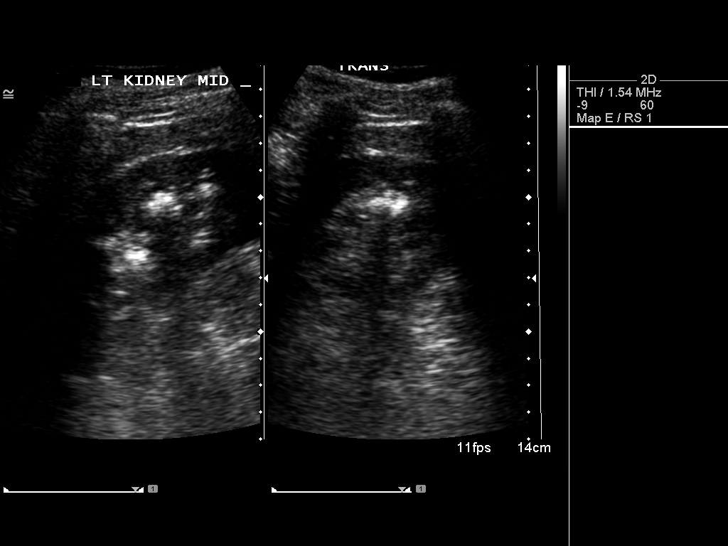
[im 88/88]
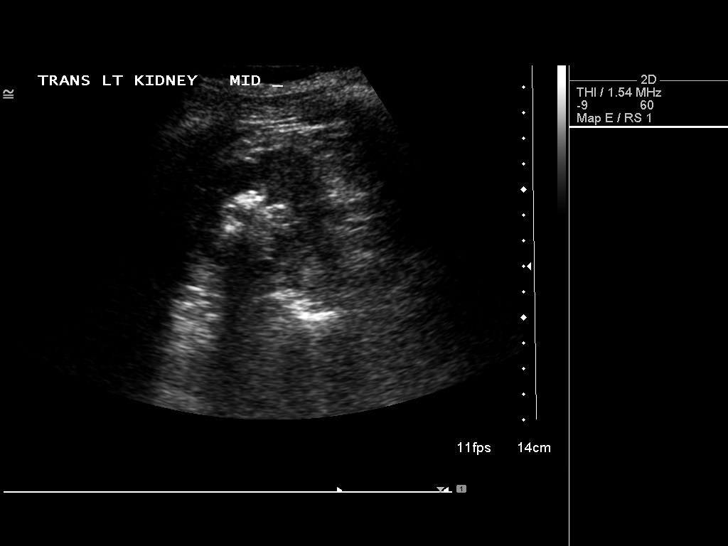

[14 of 25 positions shown; findings below may reference images not displayed]

FINDINGS: Gallbladder:  Surgically removed.

Common bile duct:  5.8 mm.

Liver:  No focal lesion identified.  Within normal limits in
parenchymal echogenicity.

IVC:  Normal

Pancreas:  Normal

Spleen:  Normal

Right Kidney:  11.2 cm in sagittal length.  There is mild to
moderate hydronephrosis which is a new finding.  There are multiple
renal calculi.  There is also a 5 mm calculus in the upper right
ureter.

Left Kidney:  11.8 cm in sagittal length.  There are multiple
calculi but no current hydronephrosis.

Abdominal aorta:  There is a focal bulge in the distal abdominal
aorta measuring 3 cm in greatest diameter.  This was not present
previously.  This is a probable early distal aortic aneurysm.
IMPRESSION: 1.  Post cholecystectomy.
2.  Bilateral renal calculi.  There is mild to moderate
hydronephrosis on the right.    There is also a 5 mm stone in the
proximal ureter.  The obstructive uropathy on the right is a new
finding.

3.  Probable small distal abdominal aortic aneurysm.  See report.

## 2011-11-17 ENCOUNTER — Ambulatory Visit: Payer: BC Managed Care – PPO | Admitting: Internal Medicine

## 2011-12-07 ENCOUNTER — Ambulatory Visit (INDEPENDENT_AMBULATORY_CARE_PROVIDER_SITE_OTHER): Payer: BC Managed Care – PPO | Admitting: Cardiovascular Disease

## 2011-12-07 ENCOUNTER — Encounter: Payer: Self-pay | Admitting: Cardiovascular Disease

## 2011-12-07 VITALS — BP 150/80 | HR 74 | Ht 69.0 in | Wt 182.0 lb

## 2011-12-07 DIAGNOSIS — R030 Elevated blood-pressure reading, without diagnosis of hypertension: Secondary | ICD-10-CM

## 2011-12-07 DIAGNOSIS — Z8679 Personal history of other diseases of the circulatory system: Secondary | ICD-10-CM

## 2011-12-07 DIAGNOSIS — Z9889 Other specified postprocedural states: Secondary | ICD-10-CM

## 2011-12-07 MED ORDER — LOSARTAN POTASSIUM 50 MG PO TABS: 50.0000 mg | ORAL_TABLET | Freq: Every day | ORAL | Status: DC

## 2011-12-07 NOTE — Assessment & Plan Note (Signed)
Change to cozaar and take in am.  F/U 4-6 weeks and recheck.

## 2011-12-07 NOTE — Assessment & Plan Note (Signed)
Cholesterol is at goal.  Continue current dose of statin and diet Rx.  No myalgias or side effects.  F/U  LFT's in 6 months. Lab Results  Component Value Date   LDLCALC 108* 07/24/2008             

## 2011-12-07 NOTE — Progress Notes (Signed)
Patient ID: Timothy [name], male   DOB: 24-Sep-1950, 61 y.o.   MRN: [id number] HPI:61 year old male seen by Dr Jens Som in 8/12 and 9/12  for evaluation of abdominal aortic aneurysm. He had an abdominal ultrasound in April 2012 that revealed a focal bulge in the distal abdominal aorta measuring 3 cm in greatest diameter. This is a probable early distal aortic aneurysm. Echo in August of 2012 showed normal LV function and no thoracic aneurysm. Since he was last seen he denies dyspnea, chest pain, palpitations or syncope. He did bring records of his home blood pressure. His systolic typically runs 140-145. The diastolic appears to be 90. He thinks the lisinopril makes him drowsy.  Takes it at night.    ROS: Denies fever, malais, weight loss, blurry vision, decreased visual acuity, cough, sputum, SOB, hemoptysis, pleuritic pain, palpitaitons, heartburn, abdominal pain, melena, lower extremity edema, claudication, or rash.  All other systems reviewed and negative  General: Affect appropriate Healthy:  appears stated age HEENT: normal Neck supple with no adenopathy JVP normal no bruits no thyromegaly Lungs clear with no wheezing and good diaphragmatic motion Heart:  S1/S2 no murmur, no rub, gallop or click PMI normal Abdomen: benighn, BS positve, no tenderness,  Palpable abdominal Ao S/P choly lap no bruit.  No HSM or HJR Distal pulses intact with no bruits No edema Neuro non-focal Skin warm and dry No muscular weakness   Current Outpatient Prescriptions  Medication Sig Dispense Refill  . aspirin EC 81 MG tablet Take 1 tablet (81 mg total) by mouth daily.      . Doxylamine Succinate, Sleep, (SLEEP AID PO) Take by mouth at bedtime.        Marland Kitchen lisinopril (PRINIVIL,ZESTRIL) 10 MG tablet Take 1 tablet (10 mg total) by mouth daily.  30 tablet  11  . naproxen (NAPROSYN) 500 MG tablet Take 500 mg by mouth 2 (two) times daily with a meal.        . ranitidine (ZANTAC) 150 MG tablet Take 150 mg by mouth 2  (two) times daily.          Allergies  Doxycycline; Dye fdc blue; Penicillins; and Sulfa antibiotics  Electrocardiogram:  NSR rate 74 normal ECG  Assessment and Plan

## 2011-12-07 NOTE — Assessment & Plan Note (Signed)
Palpable on exam  F/U duplex

## 2011-12-07 NOTE — Patient Instructions (Addendum)
Your physician has requested that you have an abdominal aorta ULTRASOUND TO BE ON SAME DAY AS FOLLOW UP APP WITH DR Eden Emms  During this test, an ultrasound is used to evaluate the aorta. Allow 30 minutes for this exam. Do not eat after midnight the day before and avoid carbonated beverages  Your physician recommends that you schedule a follow-up appointment in: 4-5 WEEKS SAME DAY TO FOLLOW U/S  Your physician has recommended you make the following change in your medication:   STOP LISINOPRIL DUE TO FATIGUE START COZAAR 50 MG DAILY- MAY TAKE MORNING OR IN EVENING

## 2012-01-04 ENCOUNTER — Encounter: Payer: Self-pay | Admitting: *Deleted

## 2012-01-05 ENCOUNTER — Encounter (INDEPENDENT_AMBULATORY_CARE_PROVIDER_SITE_OTHER): Payer: BC Managed Care – PPO

## 2012-01-05 ENCOUNTER — Encounter: Payer: Self-pay | Admitting: Cardiovascular Disease

## 2012-01-05 ENCOUNTER — Ambulatory Visit (INDEPENDENT_AMBULATORY_CARE_PROVIDER_SITE_OTHER): Payer: BC Managed Care – PPO | Admitting: Cardiovascular Disease

## 2012-01-05 VITALS — BP 140/81 | HR 50 | Ht 70.0 in | Wt 182.0 lb

## 2012-01-05 DIAGNOSIS — R030 Elevated blood-pressure reading, without diagnosis of hypertension: Secondary | ICD-10-CM

## 2012-01-05 DIAGNOSIS — Z8679 Personal history of other diseases of the circulatory system: Secondary | ICD-10-CM

## 2012-01-05 DIAGNOSIS — I714 Abdominal aortic aneurysm, without rupture: Secondary | ICD-10-CM

## 2012-01-05 DIAGNOSIS — E785 Hyperlipidemia, unspecified: Secondary | ICD-10-CM

## 2012-01-05 DIAGNOSIS — I77811 Abdominal aortic ectasia: Secondary | ICD-10-CM

## 2012-01-05 NOTE — Assessment & Plan Note (Signed)
Cholesterol is at goal.  Continue current dose of statin and diet Rx.  No myalgias or side effects.  F/U  LFT's in 6 months. Lab Results  Component Value Date   LDLCALC 108* 07/24/2008

## 2012-01-05 NOTE — Assessment & Plan Note (Signed)
Stable by duplex today less than 3.0 cm  F/U duplex in 2 years

## 2012-01-05 NOTE — Progress Notes (Signed)
Patient ID: Timothy [name], male   DOB: 03/03/1951, 61 y.o.   MRN: [id number] HPI:61 year old male seen by Dr Jens Som in 8/12 and 9/12 for evaluation of abdominal aortic aneurysm. He had an abdominal ultrasound in April 2012 that revealed a focal bulge in the distal abdominal aorta measuring 3 cm in greatest diameter. This is a probable early distal aortic aneurysm. Echo in August of 2012 showed normal LV function and no thoracic aneurysm. Since he was last seen he denies dyspnea, chest pain, palpitations or syncope. Last visit lisinopril stopped due to drowsyness and cozaar started  Tolerating this better  Has not taken BP at home    Preliminary [place] of AAA looked at and under 3.0 cm  stable   ROS: Denies fever, malais, weight loss, blurry vision, decreased visual acuity, cough, sputum, SOB, hemoptysis, pleuritic pain, palpitaitons, heartburn, abdominal pain, melena, lower extremity edema, claudication, or rash.  All other systems reviewed and negative  General: Affect appropriate Healthy:  appears stated age HEENT: normal Neck supple with no adenopathy JVP normal no bruits no thyromegaly Lungs clear with no wheezing and good diaphragmatic motion Heart:  S1/S2 no murmur, no rub, gallop or click PMI normal Abdomen: benighn, BS positve, no tenderness, abdominal Ao palpable no bruit.  No HSM or HJR Distal pulses intact with no bruits No edema Neuro non-focal Skin warm and dry No muscular weakness   Current Outpatient Prescriptions  Medication Sig Dispense Refill  . aspirin EC 81 MG tablet Take 1 tablet (81 mg total) by mouth daily.      . Doxylamine Succinate, Sleep, (SLEEP AID PO) Take by mouth at bedtime.        Marland Kitchen losartan (COZAAR) 50 MG tablet Take 1 tablet (50 mg total) by mouth daily.  30 tablet  5  . naproxen (NAPROSYN) 500 MG tablet Take 500 mg by mouth 2 (two) times daily with a meal.        . ranitidine (ZANTAC) 150 MG tablet Take 150 mg by mouth 2 (two) times daily.           Allergies  Doxycycline; Dye fdc blue; Penicillins; and Sulfa antibiotics  Electrocardiogram:  Assessment and Plan

## 2012-01-05 NOTE — Assessment & Plan Note (Signed)
Continue Cozaar He will monitor at home Increase to 100mg  if home readings are high

## 2012-01-05 NOTE — Patient Instructions (Signed)
Your physician wants you to follow-up in:  6 MONTHS WITH DR NISHAN  You will receive a reminder letter in the mail two months in advance. If you don't receive a letter, please call our office to schedule the follow-up appointment. Your physician recommends that you continue on your current medications as directed. Please refer to the Current Medication list given to you today. 

## 2012-04-12 ENCOUNTER — Encounter: Payer: Self-pay | Admitting: Internal Medicine

## 2012-04-12 ENCOUNTER — Ambulatory Visit (INDEPENDENT_AMBULATORY_CARE_PROVIDER_SITE_OTHER): Payer: BC Managed Care – PPO | Admitting: Internal Medicine

## 2012-04-12 VITALS — BP 124/82 | HR 67 | Temp 98.4°F | Wt 186.0 lb

## 2012-04-12 DIAGNOSIS — G479 Sleep disorder, unspecified: Secondary | ICD-10-CM

## 2012-04-12 DIAGNOSIS — J209 Acute bronchitis, unspecified: Secondary | ICD-10-CM

## 2012-04-12 DIAGNOSIS — M1990 Unspecified osteoarthritis, unspecified site: Secondary | ICD-10-CM

## 2012-04-12 MED ORDER — BENZONATATE 200 MG PO CAPS: 200.0000 mg | ORAL_CAPSULE | Freq: Three times a day (TID) | ORAL | Status: DC | PRN

## 2012-04-12 MED ORDER — ESZOPICLONE 2 MG PO TABS: ORAL_TABLET | ORAL | Status: DC

## 2012-04-12 MED ORDER — AZITHROMYCIN 250 MG PO TABS: ORAL_TABLET | ORAL | Status: DC

## 2012-04-12 NOTE — Progress Notes (Signed)
Subjective:    Patient ID: Timothy [name], male    DOB: 11-14-1950, 61 y.o.   MRN: [id number]  HPI  #1Symptoms began 3 weeks ago as a dry cough. He did have some extrinsic symptoms with itchy, watery eyes and sneezing. There was associated head congestion without frontal headache , facial pain, or nasal purulence. He has had some shortness of breath without wheezing. There has been no sputum production. He had may have had low-grade fever as well as arthralgias the first week of symptoms. Robitussin-DM has not been of benefit. Significant past medical history includes urticaria with doxycycline and penicillin. He's had a rash with sulfa antibiotics.  #2 Sleep Dysfunction: Onset:2 years Difficulty going to sleep:no Frequent awakening: Early awakening:yes between 3& 5 am; going to bed 10-11 pm Nightmares:no Abnormal leg movement: ? occasioanlly Snoring:occasional Apnea:no Risk factors/sleep hygiene: Stimulants:no Alcohol intake:no Reading, watching TV, eating in bed: no Daytime naps:no Stress/anxiety:no Work/travel factors:no mpact: Daytime hypersomnolence: no Motor vehicle accident/motor dysfunction:no Treatment to date/efficacy: OTC sleep Aid with benefit    #3 Extremity pain Location:R knee &  Both shoulders Onset: few years Trigger/injury: PMH of rotator cuff R shoulder ; arthroscopy 2001 Pain quality:knee burns; shoulders ache Pain severity: knee up to 7; shoulder to 5 Duration:constant Radiation:no Exacerbating factors: elliptical & exercise machines aggravate  Treatment/response:Aleve with benefit. PT has helped shoulders    Review of Systems  Constitutional: no fever, chills, sweats, change in weight  ( except with RTI , see above) Musculoskeletal:no  muscle cramps or pain; no  joint stiffness, redness, or swelling Skin:no rash, color change Neuro: no weakness; incontinence (stool/urine); numbness and tingling Heme:no lymphadenopathy; abnormal bruising or  bleeding               Objective:   Physical Exam Gen.: Thin but healthy and well-nourished in appearance. Alert, appropriate and cooperative throughout exam.   Eyes: No corneal or conjunctival inflammation noted.  Ears: External  ear exam reveals no significant lesions or deformities. Canals clear .TMs normal. Hearing is grossly normal bilaterally. Nose: External nasal exam reveals no deformity or inflammation. Nasal mucosa are pink and moist. No lesions or exudates noted.  Mouth: Oral mucosa and oropharynx reveal no lesions or exudates. Teeth in good repair. Neck: No deformities, masses, or tenderness noted. Range of motion good. Lungs: Normal respiratory effort; chest expands symmetrically. Lungs are clear to auscultation without rales, wheezes, or increased work of breathing. Heart: Normal rate and rhythm. Normal S1 and S2. No gallop, click, or rub. S4 w/o murmur. Abdomen: Bowel sounds normal; abdomen soft and nontender. No masses, organomegaly or hernias noted.                                                          Musculoskeletal/extremities: No deformity or scoliosis noted of  the thoracic or lumbar spine. No clubbing, cyanosis, edema, or deformity noted. Range of motion  normal .Tone & strength  normal.Joints normal. Nail health  good. He has mild crepitus of the knees, greater on the right. No effusion is present. No crepitus noted in the shoulders. Shoulder extension superiorly is full without associated discomfort Vascular: Carotid, radial artery, dorsalis pedis and  posterior tibial pulses are full and equal. No bruits present. Neurologic: Alert and oriented x3. Deep tendon reflexes symmetrical and normal.  Skin: Intact without suspicious lesions or rashes. Lymph: No cervical, axillary lymphadenopathy present. Psych: Mood and affect are normal. Normally interactive                                                                                          Assessment & Plan:  #1 acute bronchitis w/o bronchospasm #2 sleep dysfunction #3 DJD Plan: See orders and recommendations

## 2012-04-12 NOTE — Patient Instructions (Addendum)
To prevent sleep dysfunction follow these instructions for sleep hygiene. Do not read, watch TV, or eat in bed. Do not get into bed until you are ready to turn off the light &  to go to sleep. Do not ingest stimulants ( decongestants, diet pills, nicotine, caffeine) after the evening meal.  If insomnia is a chronic problem; Sleep specialty evaluation is indicated. There are many different causes of disturbed sleep including restless leg syndrome , sleep apnea, et Karie Soda. Chronic use of sleeping pills has a number of adverse effects. These include tolerance or lack of effectiveness; automated  behavior such as driving or eating while actually asleep ; & also potential  for habituation.The exact etiology of insomnia  should be be diagnosed for optimal response and avoidance of these potential adverse effects.  Nonsteroidal anti-inflammatory agents such as Aleve and should be taken as infrequently as possible & only with food in  the stomach. These are associated with an increased risk of gastric bleeding and cardiac disease if taken on a regular basis. Arthritis strength Tylenol is the safest medication to take at bedtime as it has no gastric or cardiovascular risk. Use an anti-inflammatory cream such as Aspercreme or Zostrix cream twice a day to the affected joint(s) as needed. In lieu of this warm moist compresses or  hot water bottle can be used. Do not apply ice .

## 2012-06-17 ENCOUNTER — Other Ambulatory Visit: Payer: Self-pay

## 2012-06-17 MED ORDER — LOSARTAN POTASSIUM 50 MG PO TABS: 50.0000 mg | ORAL_TABLET | Freq: Every day | ORAL | Status: DC

## 2012-06-18 ENCOUNTER — Other Ambulatory Visit: Payer: Self-pay | Admitting: Cardiovascular Disease

## 2012-06-18 MED ORDER — LOSARTAN POTASSIUM 50 MG PO TABS: 50.0000 mg | ORAL_TABLET | Freq: Every day | ORAL | Status: DC

## 2012-06-18 NOTE — Telephone Encounter (Signed)
Per refill line 

## 2012-12-03 ENCOUNTER — Encounter: Payer: Self-pay | Admitting: Internal Medicine

## 2012-12-03 ENCOUNTER — Ambulatory Visit (INDEPENDENT_AMBULATORY_CARE_PROVIDER_SITE_OTHER): Payer: BC Managed Care – PPO | Admitting: Internal Medicine

## 2012-12-03 VITALS — BP 128/76 | HR 63 | Temp 98.4°F | Wt 185.0 lb

## 2012-12-03 DIAGNOSIS — J309 Allergic rhinitis, unspecified: Secondary | ICD-10-CM

## 2012-12-03 MED ORDER — FLUTICASONE PROPIONATE 50 MCG/ACT NA SUSP: 1.0000 | Freq: Two times a day (BID) | NASAL | Status: DC | PRN

## 2012-12-03 NOTE — Progress Notes (Signed)
  Subjective:    Patient ID: Timothy [name], male    DOB: 12/04/1950, 62 y.o.   MRN: [id number]  HPI   Symptoms began approximately 8 days ago and sneezing associated with some extrinsic eye symptoms of watery, itchy eyes. This was followed by diffuse myalgias and intermittent nausea 7 intermittent constipation. He also had a headache which he believes was occipital; this resolved with naproxen. He's had sweating a couple of mornings and possibly intermittent fever. He feels pressure in his ears.  Significant history includes documented urticaria with doxycycline and penicillin as well as an nonspecific rash with sulfa.    Review of Systems  He is not had associated chills. He also denies frontal headache, facial pain, nasal purulence, dental pain, sore throat, or otic discharge.  He has no significant cough, sputum, wheezing, or dyspnea.     Objective:   Physical Exam General appearance:good health ;well nourished; no acute distress or increased work of breathing is present.  No  lymphadenopathy about the head, neck, or axilla noted.   Eyes: No conjunctival inflammation or lid edema is present. There is no scleral icterus. Extraocular motion intact. Field of vision normal. Vision normal with lenses  Ears:  External ear exam shows no significant lesions or deformities.  Otoscopic examination reveals clear canals, tympanic membranes are intact bilaterally without bulging, retraction, inflammation or discharge.  Nose:  External nasal examination shows no deformity or inflammation. Nasal mucosa are pink and moist without  exudates. Nasal polyp on the left   Oral exam: Dental hygiene is good; lips and gums are healthy appearing.There is no oropharyngeal erythema or exudate noted.   Neck:  No deformities,  masses, or tenderness noted.   Supple with full range of motion without pain.   Heart:  Normal rate and regular rhythm. S1 and S2 normal without gallop, murmur, click, rub or other extra  sounds.   Lungs:Chest clear to auscultation; no wheezes, rhonchi,rales ,or rubs present.No increased work of breathing.    Aorta palpable ; no AAA  Extremities:  No cyanosis, edema, or clubbing  noted    Skin: Warm & dry w/o jaundice or tenting.         Assessment & Plan:  #1 upper respiratory tract symptoms suggest some extrinsic process  #2 myalgias resolved  #3 multiple glass antibiotic allergies  Plan: See orders and recommendations

## 2012-12-03 NOTE — Patient Instructions (Addendum)

## 2013-03-07 ENCOUNTER — Other Ambulatory Visit: Payer: Self-pay | Admitting: Cardiovascular Disease

## 2013-06-18 ENCOUNTER — Other Ambulatory Visit: Payer: Self-pay

## 2013-06-18 MED ORDER — LOSARTAN POTASSIUM 50 MG PO TABS: 50.0000 mg | ORAL_TABLET | Freq: Every day | ORAL | Status: DC

## 2013-07-03 ENCOUNTER — Encounter: Payer: Self-pay | Admitting: Cardiovascular Disease

## 2013-07-03 ENCOUNTER — Ambulatory Visit (INDEPENDENT_AMBULATORY_CARE_PROVIDER_SITE_OTHER): Payer: BC Managed Care – PPO | Admitting: Cardiovascular Disease

## 2013-07-03 VITALS — BP 150/98 | HR 71 | Ht 70.0 in | Wt 197.0 lb

## 2013-07-03 DIAGNOSIS — I714 Abdominal aortic aneurysm, without rupture, unspecified: Secondary | ICD-10-CM

## 2013-07-03 DIAGNOSIS — I10 Essential (primary) hypertension: Secondary | ICD-10-CM

## 2013-07-03 NOTE — Assessment & Plan Note (Signed)
Well controlled.  Continue current medications and low sodium Dash type diet.    

## 2013-07-03 NOTE — Progress Notes (Signed)
Patient ID: Timothy [name], male   DOB: 12/24/1950, 63 y.o.   MRN: [id number] HPI:63 year old male seen by Dr Stanford Breed in 8/12 and 9/12 for evaluation of abdominal aortic aneurysm. He had an abdominal ultrasound in April 2012 that revealed a focal bulge in the distal abdominal aorta measuring 3 cm in greatest diameter. This is a probable early distal aortic aneurysm. Echo in August of 2012 showed normal LV function and no thoracic aneurysm. Since he was last seen he denies dyspnea, chest pain, palpitations or syncope. Last visit lisinopril stopped due to drowsyness and cozaar started Tolerating this better Has not taken BP at home   [place] 2013  2.7 cm  Needs f/u 6/15    Working doing Art conservation still   ROS: Denies fever, malais, weight loss, blurry vision, decreased visual acuity, cough, sputum, SOB, hemoptysis, pleuritic pain, palpitaitons, heartburn, abdominal pain, melena, lower extremity edema, claudication, or rash.  All other systems reviewed and negative  General: Affect appropriate Healthy:  appears stated age 63: normal Neck supple with no adenopathy JVP normal no bruits no thyromegaly Lungs clear with no wheezing and good diaphragmatic motion Heart:  S1/S2 no murmur, no rub, gallop or click PMI normal Abdomen: benighn, BS positve, no tenderness, no AAA no bruit.  No HSM or HJR Distal pulses intact with no bruits No edema Neuro non-focal Skin warm and dry No muscular weakness   Current Outpatient Prescriptions  Medication Sig Dispense Refill  . aspirin EC 81 MG tablet Take 1 tablet (81 mg total) by mouth daily.      . fluticasone (FLONASE) 50 MCG/ACT nasal spray Place 1 spray into the nose 2 (two) times daily as needed for rhinitis.  16 g  5  . losartan (COZAAR) 50 MG tablet Take 1 tablet (50 mg total) by mouth daily.  90 tablet  2  . naproxen (NAPROSYN) 500 MG tablet Take 500 mg by mouth 2 (two) times daily with a meal.        . ranitidine (ZANTAC) 150 MG tablet Take  150 mg by mouth 2 (two) times daily.        . fluticasone (FLONASE) 50 MCG/ACT nasal spray 1 spray by Nasal route 2 (two) times daily.  16 g  2   No current facility-administered medications for this visit.    Allergies  Doxycycline; Dye fdc blue; Penicillins; and Sulfa antibiotics  Electrocardiogram:  NSR rate 71 normal   Assessment and Plan

## 2013-07-03 NOTE — Patient Instructions (Signed)
Your physician recommends that you continue on your current medications as directed. Please refer to the Current Medication list given to you today.  Your physician has requested that you have an abdominal aorta duplex. During this test, an ultrasound is used to evaluate the aorta. Allow 30 minutes for this exam. Do not eat after midnight the day before and avoid carbonated beverages. (schedule for June 2015)  Your physician wants you to follow-up in: 1 year with Dr Blima Singer will receive a reminder letter in the mail two months in advance. If you don't receive a letter, please call our office to schedule the follow-up appointment.

## 2013-07-03 NOTE — Assessment & Plan Note (Signed)
2.7cm  No palpable mass F/U duplex June of this year

## 2013-07-29 ENCOUNTER — Encounter: Payer: Self-pay | Admitting: Internal Medicine

## 2013-08-20 ENCOUNTER — Encounter: Payer: Self-pay | Admitting: Internal Medicine

## 2013-09-23 ENCOUNTER — Encounter: Payer: Self-pay | Admitting: Internal Medicine

## 2013-10-03 ENCOUNTER — Encounter (HOSPITAL_COMMUNITY): Payer: BC Managed Care – PPO

## 2013-10-15 ENCOUNTER — Ambulatory Visit (HOSPITAL_COMMUNITY): Payer: BC Managed Care – PPO | Attending: Cardiovascular Disease | Admitting: Cardiology

## 2013-10-15 DIAGNOSIS — E785 Hyperlipidemia, unspecified: Secondary | ICD-10-CM | POA: Insufficient documentation

## 2013-10-15 DIAGNOSIS — I10 Essential (primary) hypertension: Secondary | ICD-10-CM | POA: Insufficient documentation

## 2013-10-15 DIAGNOSIS — I714 Abdominal aortic aneurysm, without rupture, unspecified: Secondary | ICD-10-CM | POA: Insufficient documentation

## 2013-10-15 NOTE — Progress Notes (Signed)
Abdominal aorta duplex completed.  

## 2013-10-20 ENCOUNTER — Telehealth: Payer: Self-pay | Admitting: Cardiovascular Disease

## 2013-10-20 NOTE — Telephone Encounter (Signed)
New message     Pt has an abd aneursym---can get in a hot tub and want test results

## 2013-10-20 NOTE — Telephone Encounter (Signed)
Called patient back and I reported findings of his abdominal ultrasound. He is thinking about purchasing a hot tub and wanted to know if Dr.Nishan's opinion based on his medical condition. Advised will forward message to Dr.Nishan but probably not get call back until 6/24.

## 2013-10-20 NOTE — Telephone Encounter (Signed)
Hot Tub is ok

## 2013-10-22 NOTE — Telephone Encounter (Signed)
LEFT  VOICE  MAIL THAT   IS  OKAY TO  PURCHASE  HOT TUB  PER  DR Johnsie Cancel .[employer] Housekeeper

## 2013-11-11 ENCOUNTER — Ambulatory Visit (AMBULATORY_SURGERY_CENTER): Payer: Self-pay

## 2013-11-11 VITALS — Ht 70.0 in | Wt 190.6 lb

## 2013-11-11 DIAGNOSIS — Z1211 Encounter for screening for malignant neoplasm of colon: Secondary | ICD-10-CM

## 2013-11-11 MED ORDER — NA SULFATE-K SULFATE-MG SULF 17.5-3.13-1.6 GM/177ML PO SOLN: ORAL | Status: DC

## 2013-11-11 NOTE — Progress Notes (Signed)
Per pt, no allergies to soy or egg products.Pt not taking any weight loss meds or using  O2 at home. 

## 2013-11-13 ENCOUNTER — Encounter: Payer: Self-pay | Admitting: Internal Medicine

## 2013-11-25 ENCOUNTER — Ambulatory Visit (AMBULATORY_SURGERY_CENTER): Payer: BC Managed Care – PPO | Admitting: Internal Medicine

## 2013-11-25 ENCOUNTER — Encounter: Payer: Self-pay | Admitting: Internal Medicine

## 2013-11-25 VITALS — BP 110/70 | HR 50 | Temp 98.5°F | Resp 18 | Ht 70.0 in | Wt 190.0 lb

## 2013-11-25 DIAGNOSIS — D126 Benign neoplasm of colon, unspecified: Secondary | ICD-10-CM

## 2013-11-25 DIAGNOSIS — Z1211 Encounter for screening for malignant neoplasm of colon: Secondary | ICD-10-CM

## 2013-11-25 MED ORDER — SODIUM CHLORIDE 0.9 % IV SOLN: 500.0000 mL | INTRAVENOUS | Status: DC

## 2013-11-25 NOTE — Op Note (Signed)
[place]  [address] [place], [id number]   COLONOSCOPY PROCEDURE REPORT  PATIENT: [name], Timothy  MR#: [id number] BIRTHDATE: [birth year] , 62  yrs. old GENDER: Male ENDOSCOPIST: Gatha Mayer, MD, [facility] PROCEDURE DATE:  11/25/2013 PROCEDURE:   Colonoscopy with biopsy and snare polypectomy First Screening Colonoscopy - Avg.  risk and is 50 yrs.  old or older - No.  Prior Negative Screening - Now for repeat screening. 10 or more years since last screening  History of Adenoma - Now for follow-up colonoscopy & has been > or = to 3 yrs.  N/A  Polyps Removed Today? Yes. ASA CLASS:   Class III INDICATIONS:average risk screening and Last colonoscopy performed 10 years ago. MEDICATIONS: Propofol (Diprivan) 340 mg IV, MAC sedation, administered by CRNA, and These medications were titrated to patient response per physician's verbal order  DESCRIPTION OF PROCEDURE:   After the risks benefits and alternatives of the procedure were thoroughly explained, informed consent was obtained.  A digital rectal exam revealed no abnormalities of the rectum, A digital rectal exam revealed no prostatic nodules, and A digital rectal exam revealed the prostate was not enlarged.   The LB [id number]  endoscope was introduced through the anus and advanced to the cecum, which was identified by both the appendix and ileocecal valve. No adverse events experienced.   The quality of the prep was excellent using Suprep  The instrument was then slowly withdrawn as the colon was fully examined.  COLON FINDINGS: Two diminutive sessile polyps were found in the transverse colon and sigmoid colon.  A polypectomy was performed with cold forceps and with a cold snare.  The resection was complete and the polyp tissue was completely retrieved.   The colon mucosa was otherwise normal.   A right colon retroflexion was performed.  Retroflexed views revealed no abnormalities. The time to cecum=1  minutes 56 seconds.  Withdrawal time=11 minutes 27 seconds.  The scope was withdrawn and the procedure completed. COMPLICATIONS: There were no complications.  ENDOSCOPIC IMPRESSION: 1.   Two diminutive sessile polyps were found in the transverse colon and sigmoid colon; polypectomy was performed with cold forceps and with a cold snare 2.   The colon mucosa was otherwise normal - excellent prep - second screening  RECOMMENDATIONS: Timing of repeat colonoscopy will be determined by pathology findings.   eSigned:  Gatha Mayer, MD, [facility] 11/25/2013 11:42 AM   cc: The Patient

## 2013-11-25 NOTE — Progress Notes (Signed)
Called to room to assist during endoscopic procedure.  Patient ID and intended procedure confirmed with present staff. Received instructions for my participation in the procedure from the performing physician.  

## 2013-11-25 NOTE — Patient Instructions (Addendum)
I found and removed 2 small polyps that look benign.  I will let you know pathology results and when to have another routine colonoscopy by mail.  I appreciate the opportunity to care for you. Gatha Mayer, MD, FACG    YOU HAD AN ENDOSCOPIC PROCEDURE TODAY AT [facility]: Refer to the procedure report that was given to you for any specific questions about what was found during the examination.  If the procedure report does not answer your questions, please call your gastroenterologist to clarify.  If you requested that your care partner not be given the details of your procedure findings, then the procedure report has been included in a sealed envelope for you to review at your convenience later.  YOU SHOULD EXPECT: Some feelings of bloating in the abdomen. Passage of more gas than usual.  Walking can help get rid of the air that was put into your GI tract during the procedure and reduce the bloating. If you had a lower endoscopy (such as a colonoscopy or flexible sigmoidoscopy) you may notice spotting of blood in your stool or on the toilet paper. If you underwent a bowel prep for your procedure, then you may not have a normal bowel movement for a few days.  DIET: Your first meal following the procedure should be a light meal and then it is ok to progress to your normal diet.  A half-sandwich or bowl of soup is an example of a good first meal.  Heavy or fried foods are harder to digest and may make you feel nauseous or bloated.  Likewise meals heavy in dairy and vegetables can cause extra gas to form and this can also increase the bloating.  Drink plenty of fluids but you should avoid alcoholic beverages for 24 hours.  ACTIVITY: Your care partner should take you home directly after the procedure.  You should plan to take it easy, moving slowly for the rest of the day.  You can resume normal activity the day after the procedure however you should NOT DRIVE or use heavy machinery  for 24 hours (because of the sedation medicines used during the test).    SYMPTOMS TO REPORT IMMEDIATELY: A gastroenterologist can be reached at any hour.  During normal business hours, 8:30 AM to 5:00 PM Monday through Friday, call [phone].  After hours and on weekends, please call the GI answering service at [phone] who will take a message and have the physician on call contact you.   Following lower endoscopy (colonoscopy or flexible sigmoidoscopy):  Excessive amounts of blood in the stool  Significant tenderness or worsening of abdominal pains  Swelling of the abdomen that is new, acute  Fever of 100F or higher  FOLLOW UP: If any biopsies were taken you will be contacted by phone or by letter within the next 1-3 weeks.  Call your gastroenterologist if you have not heard about the biopsies in 3 weeks.  Our staff will call the home number listed on your records the next business day following your procedure to check on you and address any questions or concerns that you may have at that time regarding the information given to you following your procedure. This is a courtesy call and so if there is no answer at the home number and we have not heard from you through the emergency physician on call, we will assume that you have returned to your regular daily activities without incident.  SIGNATURES/CONFIDENTIALITY: You and/or your care  partner have signed paperwork which will be entered into your electronic medical record.  These signatures attest to the fact that that the information above on your After Visit Summary has been reviewed and is understood.  Full responsibility of the confidentiality of this discharge information lies with you and/or your care-partner.    Resume medications. Information given on polyps with discharge instructions.

## 2013-11-25 NOTE — Progress Notes (Signed)
Report to PACU, RN, vss, BBS= Clear.  

## 2013-11-26 ENCOUNTER — Telehealth: Payer: Self-pay

## 2013-11-26 NOTE — Telephone Encounter (Signed)
  Follow up Call-  Call back number 11/25/2013  Post procedure Call Back phone  # [phone]  Permission to leave phone message Yes     Patient questions:  Do you have a fever, pain , or abdominal swelling? No. Pain Score  0 *  Have you tolerated food without any problems? Yes.    Have you been able to return to your normal activities? Yes.    Do you have any questions about your discharge instructions: Diet   No. Medications  No. Follow up visit  No.  Do you have questions or concerns about your Care? No.  Actions: * If pain score is 4 or above: No action needed, pain <4.

## 2013-12-04 ENCOUNTER — Encounter: Payer: Self-pay | Admitting: Internal Medicine

## 2013-12-04 DIAGNOSIS — Z860100 Personal history of colon polyps, unspecified: Secondary | ICD-10-CM | POA: Insufficient documentation

## 2013-12-04 DIAGNOSIS — Z860101 Personal history of adenomatous and serrated colon polyps: Secondary | ICD-10-CM | POA: Insufficient documentation

## 2013-12-04 NOTE — Progress Notes (Signed)
Quick Note:  Diminutive adenoma Repeat colon 2020 ______

## 2014-03-20 ENCOUNTER — Other Ambulatory Visit: Payer: Self-pay | Admitting: Cardiovascular Disease

## 2014-07-13 ENCOUNTER — Encounter: Payer: Self-pay | Admitting: Cardiovascular Disease

## 2014-07-13 ENCOUNTER — Ambulatory Visit (INDEPENDENT_AMBULATORY_CARE_PROVIDER_SITE_OTHER): Payer: BLUE CROSS/BLUE SHIELD | Admitting: Cardiovascular Disease

## 2014-07-13 VITALS — BP 170/100 | HR 61 | Ht 70.0 in | Wt 200.1 lb

## 2014-07-13 DIAGNOSIS — I10 Essential (primary) hypertension: Secondary | ICD-10-CM

## 2014-07-13 DIAGNOSIS — I77811 Abdominal aortic ectasia: Secondary | ICD-10-CM | POA: Diagnosis not present

## 2014-07-13 DIAGNOSIS — E785 Hyperlipidemia, unspecified: Secondary | ICD-10-CM

## 2014-07-13 NOTE — Progress Notes (Signed)
Patient ID: AEON KESSNER, male   DOB: 30-Dec-1950, 64 y.o.   MRN: [id number] HPI:  64 y.o.  male seen by Dr Stanford Breed in 8/12 and 9/12 for evaluation of abdominal aortic aneurysm. He had an abdominal ultrasound in April 2012 that revealed a focal bulge in the distal abdominal aorta measuring 3 cm in greatest diameter. This is a probable early distal aortic aneurysm. Echo in August of 2012 showed normal LV function and no thoracic aneurysm. Since he was last seen he denies dyspnea, chest pain, palpitations or syncope. Last visit lisinopril stopped due to drowsyness and cozaar started Tolerating this better Has not taken BP at home   [place] 2015 2.7 cm reviewed   Needs f/u 6/17    Working doing Art conservation still   BP elevated  Discussed lifestyle mods  Drinks a beer daily Uses salt Uses Aleve daily  Taking ARB at night   ROS: Denies fever, malais, weight loss, blurry vision, decreased visual acuity, cough, sputum, SOB, hemoptysis, pleuritic pain, palpitaitons, heartburn, abdominal pain, melena, lower extremity edema, claudication, or rash.  All other systems reviewed and negative  General: Affect appropriate Healthy:  appears stated age 83: normal Neck supple with no adenopathy JVP normal no bruits no thyromegaly Lungs clear with no wheezing and good diaphragmatic motion Heart:  S1/S2 no murmur, no rub, gallop or click PMI normal Abdomen: benighn, BS positve, no tenderness, no AAA no bruit.  No HSM or HJR Distal pulses intact with no bruits No edema Neuro non-focal Skin warm and dry No muscular weakness   Current Outpatient Prescriptions  Medication Sig Dispense Refill  . aspirin EC 81 MG tablet Take 1 tablet (81 mg total) by mouth daily.    . fluticasone (FLONASE) 50 MCG/ACT nasal spray Place 1 spray into the nose 2 (two) times daily as needed for rhinitis. 16 g 5  . losartan (COZAAR) 50 MG tablet TAKE 1 TABLET BY MOUTH DAILY. 90 tablet 1  . naproxen (NAPROSYN) 500 MG tablet  Take 500 mg by mouth daily.     . ranitidine (ZANTAC) 150 MG tablet Take 150 mg by mouth 2 (two) times daily.       No current facility-administered medications for this visit.    Allergies  Doxycycline; Dye fdc blue; Penicillins; and Sulfa antibiotics  Electrocardiogram:  NSR rate 71 normal  2015  07/13/14  SR rate 61 LVH  Voltage   Assessment and Plan

## 2014-07-13 NOTE — Assessment & Plan Note (Signed)
Cholesterol is at goal.  Continue current dose of statin and diet Rx.  No myalgias or side effects.  F/U  LFT's in 6 months. Lab Results  Component Value Date   LDLCALC 108* 07/24/2008  Labs with primary

## 2014-07-13 NOTE — Assessment & Plan Note (Signed)
2.7 cm  F/u duplex 09/2015 no palpable mass

## 2014-07-13 NOTE — Patient Instructions (Signed)
Your physician recommends that you schedule a follow-up appointment in: [place]  Your physician recommends that you continue on your current medications as directed. Please refer to the Current Medication list given to you today.  Your physician has requested that you have an abdominal aorta duplex. During this test, an ultrasound is used to evaluate the aorta. Allow 30 minutes for this exam. Do not eat after midnight the day before and avoid carbonated beverages  DUE  IN    2 YEARS

## 2014-07-13 NOTE — Assessment & Plan Note (Signed)
Discussed using tylenol/ASA for pain,  Cutting back ETOH and not using salt  Will take ARB in am when needed  Will monitor home BP and f/u with me next available for possible med adjustment

## 2014-07-15 ENCOUNTER — Ambulatory Visit (INDEPENDENT_AMBULATORY_CARE_PROVIDER_SITE_OTHER): Payer: BLUE CROSS/BLUE SHIELD | Admitting: Internal Medicine

## 2014-07-15 ENCOUNTER — Encounter: Payer: Self-pay | Admitting: Internal Medicine

## 2014-07-15 VITALS — BP 136/88 | HR 56 | Temp 97.8°F | Resp 15 | Ht 70.0 in | Wt 199.5 lb

## 2014-07-15 DIAGNOSIS — E785 Hyperlipidemia, unspecified: Secondary | ICD-10-CM | POA: Diagnosis not present

## 2014-07-15 DIAGNOSIS — Z0000 Encounter for general adult medical examination without abnormal findings: Secondary | ICD-10-CM

## 2014-07-15 DIAGNOSIS — Z0189 Encounter for other specified special examinations: Secondary | ICD-10-CM

## 2014-07-15 NOTE — Patient Instructions (Signed)
  Your next office appointment will be determined based upon review of your pending labs  Those instructions will be transmitted to you by mail. Critical values will be called.    Followup as needed for any active or acute issue. Please report any significant change in your symptoms.   Minimal Blood Pressure Goal= AVERAGE < 140/90;  Ideal is an AVERAGE < 135/85. This AVERAGE should be calculated from @ least 5-7 BP readings taken @ different times of day on different days of week. You should not respond to isolated BP readings , but rather the AVERAGE for that week .Please bring your  blood pressure cuff to office visits to verify that it is reliable.It  can also be checked against the blood pressure device at the pharmacy. Finger or wrist cuffs are not dependable; an arm cuff is.

## 2014-07-15 NOTE — Progress Notes (Signed)
Subjective:    Patient ID: Timothy [name], male    DOB: 1950/05/02, 64 y.o.   MRN: [id number]  HPI He is here for a physical;acute issues includes cervical disc disease. This does require that he adjust his physical activity to prevent symptoms.  He has been on a heart healthy diet; he is going to start restricting sodium. He has not measured his blood pressure at home; the cardiologist recommended that this be initiated. He exercises 5 times a week for at least 30 minutes with no cardiopulmonary symptoms.  He has been compliant with medications without adverse effects.  Colonoscopy is up-to-date; he would need a colonoscopy follow up in 2020  Because of history of adenoma. He has no active GI symptoms at this time.  He denies numbness, tingling, weakness in the upper extremities. He has no loss of control of bladder or bowels.  Remotely he had cystoscopy for microscopic hematuria. He has no genitourinary symptoms at this time. A paternal uncle did have bladder cancer.  Review of Systems  Chest pain, palpitations, tachycardia, exertional dyspnea, paroxysmal nocturnal dyspnea, claudication or edema are absent.  Unexplained weight loss, abdominal pain, significant dyspepsia, dysphagia, melena, rectal bleeding, or persistently small caliber stools are denied.  He denies dysuria, pyuria, or hematuria.    Objective:   Physical Exam  Gen.: Adequately nourished in appearance. Alert, appropriate and cooperative throughout exam.  Appears younger than stated age  Head: Normocephalic without obvious abnormalities; no alopecia  Eyes: No corneal or conjunctival inflammation noted. Pupils equal round reactive to light and accommodation. Extraocular motion intact. Arcus present. Ears: External  ear exam reveals no significant lesions or deformities. Canals clear .TMs normal. Hearing is grossly decreased on left. Nose: exam reveals no deformity or inflammation. Nasal mucosa are pink and moist. No  lesions or exudates noted.   Mouth: Oral mucosa and oropharynx reveal no lesions or exudates. Teeth in good repair. Neck: No deformities, masses, or tenderness noted. Range of motion & Thyroid normal. Lungs: Normal respiratory effort; chest expands symmetrically. Lungs are clear to auscultation without rales, wheezes, or increased work of breathing. Heart: Normal rate and rhythm. Normal S1 and S2. No gallop, click, or rub. No murmur. Abdomen: Bowel sounds normal; abdomen soft and nontender. No masses, organomegaly or hernias noted.Aorta palpable. Genitalia: Genitalia normal except for left varices. Prostate is 1.5+ enlarged w/o asymmetry, nodularity, or induration             Musculoskeletal/extremities:  Accentuated curvature of upper thoracic spine. No clubbing, cyanosis, edema, or significant extremity  deformity noted.  Range of motion normal . Tone & strength normal. Hand joints normal  Fingernail  health good. Able to lie down & sit up w/o help.  Negative SLR bilaterally Vascular: Carotid, radial artery, dorsalis pedis and  posterior tibial pulses are full and equal. No bruits present. Neurologic: Alert and oriented x3. Deep tendon reflexes symmetrical and normal.  Gait normal     Skin: Intact without suspicious lesions or rashes. Lymph: No cervical, axillary, or inguinal lymphadenopathy present. Psych: Mood and affect are normal. Normally interactive  Assessment & Plan:  #1 comprehensive physical exam; no acute findings  Plan: see Orders  & Recommendations

## 2014-07-15 NOTE — Progress Notes (Signed)
Pre visit review using our clinic review tool, if applicable. No additional management support is needed unless otherwise documented below in the visit note. 

## 2014-07-16 ENCOUNTER — Other Ambulatory Visit (INDEPENDENT_AMBULATORY_CARE_PROVIDER_SITE_OTHER): Payer: BLUE CROSS/BLUE SHIELD

## 2014-07-16 ENCOUNTER — Other Ambulatory Visit: Payer: Self-pay | Admitting: Internal Medicine

## 2014-07-16 DIAGNOSIS — Z0189 Encounter for other specified special examinations: Secondary | ICD-10-CM

## 2014-07-16 DIAGNOSIS — Z0000 Encounter for general adult medical examination without abnormal findings: Secondary | ICD-10-CM | POA: Diagnosis not present

## 2014-07-16 LAB — BASIC METABOLIC PANEL
BUN: 23 mg/dL (ref 6–23)
CO2: 29 meq/L (ref 19–32)
Calcium: 9.6 mg/dL (ref 8.4–10.5)
Chloride: 104 mEq/L (ref 96–112)
Creatinine, Ser: 1.1 mg/dL (ref 0.40–1.50)
GFR: 71.57 mL/min (ref >60.00)
Glucose, Bld: 102 mg/dL — ABNORMAL HIGH (ref 70–99)
POTASSIUM: 4.5 meq/L (ref 3.5–5.1)
Sodium: 139 mEq/L (ref 135–145)

## 2014-07-16 LAB — HEPATIC FUNCTION PANEL
ALK PHOS: 87 U/L (ref 39–117)
ALT: 18 U/L (ref 0–53)
AST: 18 U/L (ref 0–37)
Albumin: 4.5 g/dL (ref 3.5–5.2)
Bilirubin, Direct: 0.1 mg/dL (ref 0.0–0.3)
TOTAL PROTEIN: 6.7 g/dL (ref 6.0–8.3)
Total Bilirubin: 0.6 mg/dL (ref 0.2–1.2)

## 2014-07-16 LAB — CBC WITH DIFFERENTIAL/PLATELET
BASOS ABS: 0.1 10*3/uL (ref 0.0–0.1)
BASOS PCT: 0.7 % (ref 0.0–3.0)
EOS ABS: 0.4 10*3/uL (ref 0.0–0.7)
EOS PCT: 5.1 % — AB (ref 0.0–5.0)
HCT: 42.6 % (ref 39.0–52.0)
Hemoglobin: 14.9 g/dL (ref 13.0–17.0)
Lymphocytes Relative: 16 % (ref 12.0–46.0)
Lymphs Abs: 1.3 10*3/uL (ref 0.7–4.0)
MCHC: 34.9 g/dL (ref 30.0–36.0)
MCV: 92.7 fl (ref 78.0–100.0)
Monocytes Absolute: 0.5 10*3/uL (ref 0.1–1.0)
Monocytes Relative: 5.7 % (ref 3.0–12.0)
NEUTROS ABS: 6.1 10*3/uL (ref 1.4–7.7)
Neutrophils Relative %: 72.5 % (ref 43.0–77.0)
PLATELETS: 214 10*3/uL (ref 150.0–400.0)
RBC: 4.59 Mil/uL (ref 4.22–5.81)
RDW: 12.7 % (ref 11.5–15.5)
WBC: 8.4 10*3/uL (ref 4.0–10.5)

## 2014-07-16 LAB — URINALYSIS, ROUTINE W REFLEX MICROSCOPIC
Bilirubin Urine: NEGATIVE
Ketones, ur: NEGATIVE
Nitrite: NEGATIVE
PH: 6.5 (ref 5.0–8.0)
Specific Gravity, Urine: 1.01 (ref 1.000–1.030)
Total Protein, Urine: NEGATIVE
Urine Glucose: NEGATIVE
Urobilinogen, UA: 0.2 (ref 0.0–1.0)

## 2014-07-16 LAB — PSA: PSA: 1.33 ng/mL (ref 0.10–4.00)

## 2014-07-16 LAB — TSH: TSH: 0.88 u[IU]/mL (ref 0.35–4.50)

## 2014-07-17 ENCOUNTER — Other Ambulatory Visit (INDEPENDENT_AMBULATORY_CARE_PROVIDER_SITE_OTHER): Payer: BLUE CROSS/BLUE SHIELD

## 2014-07-17 DIAGNOSIS — R739 Hyperglycemia, unspecified: Secondary | ICD-10-CM | POA: Diagnosis not present

## 2014-07-17 LAB — HEMOGLOBIN A1C: HEMOGLOBIN A1C: 5.7 % (ref 4.6–6.5)

## 2014-07-18 LAB — NMR LIPOPROFILE WITH LIPIDS
CHOLESTEROL, TOTAL: 240 mg/dL — AB (ref 100–199)
HDL PARTICLE NUMBER: 28.8 umol/L — AB (ref >=30.5)
HDL Size: 9 nm — ABNORMAL LOW (ref >=9.2)
HDL-C: 59 mg/dL (ref >39)
LARGE VLDL-P: 1.2 nmol/L (ref <=2.7)
LDL CALC: 159 mg/dL — AB (ref 0–99)
LDL Particle Number: 2131 nmol/L — ABNORMAL HIGH (ref <1000)
LDL Size: 21.1 nm (ref >=20.8)
LP-IR Score: 37 (ref <=45)
Large HDL-P: 6.7 umol/L (ref >=4.8)
SMALL LDL PARTICLE NUMBER: 834 nmol/L — AB (ref <=527)
TRIGLYCERIDES: 109 mg/dL (ref 0–149)
VLDL Size: 47.3 nm — ABNORMAL HIGH (ref <=46.6)

## 2014-07-20 ENCOUNTER — Telehealth: Payer: Self-pay

## 2014-07-20 ENCOUNTER — Other Ambulatory Visit: Payer: BLUE CROSS/BLUE SHIELD

## 2014-07-20 DIAGNOSIS — R8290 Unspecified abnormal findings in urine: Secondary | ICD-10-CM

## 2014-07-20 NOTE — Telephone Encounter (Signed)
-----   Message from Hendricks Limes, MD sent at 07/18/2014 12:43 PM EDT ----- Please obtain urine culture on 3/17 sample if possible. If not he should come to Lab for C&S

## 2014-07-20 NOTE — Telephone Encounter (Signed)
Phone call to patient advising him to come back in to leave another urine sample so it can be cultured.

## 2014-07-21 LAB — URINE CULTURE
Colony Count: NO GROWTH
ORGANISM ID, BACTERIA: NO GROWTH

## 2014-08-10 ENCOUNTER — Telehealth: Payer: Self-pay | Admitting: Internal Medicine

## 2014-08-10 ENCOUNTER — Other Ambulatory Visit: Payer: Self-pay | Admitting: Internal Medicine

## 2014-08-10 DIAGNOSIS — Z87448 Personal history of other diseases of urinary system: Secondary | ICD-10-CM

## 2014-08-10 NOTE — Telephone Encounter (Signed)
Left voice mail advising of dr hoppers note

## 2014-08-10 NOTE — Telephone Encounter (Signed)
The Urology* referral will be scheduled and you'll be notified of the time.Please call the Referral Co-Ordinator @ [phone] if you have not been notified of appointment time within 7-10 days.

## 2014-08-10 NOTE — Telephone Encounter (Signed)
Patient is requesting a referral to a urologist. He does not have one in mind currently.

## 2014-08-12 NOTE — Progress Notes (Signed)
Patient ID: Timothy [name], male   DOB: Feb 28, 1951, 64 y.o.   MRN: [id number] HPI:  64 y.o.  male seen by Dr Stanford Breed in 8/12 and 9/12 for evaluation of abdominal aortic aneurysm. He had an abdominal ultrasound in April 2012 that revealed a focal bulge in the distal abdominal aorta measuring 3 cm in greatest diameter. This is a probable early distal aortic aneurysm. Echo in August of 2012 showed normal LV function and no thoracic aneurysm. Since he was last seen he denies dyspnea, chest pain, palpitations or syncope. Last visit lisinopril stopped due to drowsyness and cozaar started Tolerating this better Has not taken BP at home   [place] 2015 2.7 cm reviewed   Needs f/u 6/17    Working doing Art conservation still   BP elevated  Discussed lifestyle mods  Drinks a beer daily Uses salt Uses Aleve daily  BP elevated last visit told to start taking his ARB in morning  He did not have home readings Microscopic hematuria to see urology Likely kidney stone passed    ROS: Denies fever, malais, weight loss, blurry vision, decreased visual acuity, cough, sputum, SOB, hemoptysis, pleuritic pain, palpitaitons, heartburn, abdominal pain, melena, lower extremity edema, claudication, or rash.  All other systems reviewed and negative  General: Affect appropriate Healthy:  appears stated age 64: normal Neck supple with no adenopathy JVP normal no bruits no thyromegaly Lungs clear with no wheezing and good diaphragmatic motion Heart:  S1/S2 no murmur, no rub, gallop or click PMI normal Abdomen: benighn, BS positve, no tenderness, no AAA no bruit.  No HSM or HJR Distal pulses intact with no bruits No edema Neuro non-focal Skin warm and dry No muscular weakness   Current Outpatient Prescriptions  Medication Sig Dispense Refill  . aspirin EC 81 MG tablet Take 1 tablet (81 mg total) by mouth daily.    . Aspirin-Acetaminophen-Caffeine (EXCEDRIN PO) Take by mouth.    . fluticasone (FLONASE) 50 MCG/ACT  nasal spray Place 1 spray into the nose 2 (two) times daily as needed for rhinitis. 16 g 5  . losartan (COZAAR) 50 MG tablet TAKE 1 TABLET BY MOUTH DAILY. 90 tablet 1  . ranitidine (ZANTAC) 150 MG tablet Take 150 mg by mouth 2 (two) times daily.       No current facility-administered medications for this visit.    Allergies  Doxycycline; Dye fdc blue; Penicillins; and Sulfa antibiotics  Electrocardiogram:  NSR rate 71 normal  2015  07/13/14  SR rate 61 LVH  Voltage   Assessment and Plan

## 2014-08-13 ENCOUNTER — Ambulatory Visit (INDEPENDENT_AMBULATORY_CARE_PROVIDER_SITE_OTHER): Payer: BLUE CROSS/BLUE SHIELD | Admitting: Cardiovascular Disease

## 2014-08-13 ENCOUNTER — Encounter: Payer: Self-pay | Admitting: Cardiovascular Disease

## 2014-08-13 VITALS — BP 152/94 | HR 71 | Ht 70.0 in | Wt 199.0 lb

## 2014-08-13 DIAGNOSIS — I517 Cardiomegaly: Secondary | ICD-10-CM | POA: Diagnosis not present

## 2014-08-13 DIAGNOSIS — E785 Hyperlipidemia, unspecified: Secondary | ICD-10-CM

## 2014-08-13 DIAGNOSIS — Z87448 Personal history of other diseases of urinary system: Secondary | ICD-10-CM

## 2014-08-13 DIAGNOSIS — I10 Essential (primary) hypertension: Secondary | ICD-10-CM

## 2014-08-13 MED ORDER — LOSARTAN POTASSIUM 100 MG PO TABS: 100.0000 mg | ORAL_TABLET | Freq: Every day | ORAL | Status: DC

## 2014-08-13 NOTE — Assessment & Plan Note (Signed)
F/U [facility] No symptoms now but likely passed kidney stone last week

## 2014-08-13 NOTE — Assessment & Plan Note (Signed)
Cholesterol is at goal.  Continue current dose of statin and diet Rx.  No myalgias or side effects.  F/U  LFT's in 6 months. Lab Results  Component Value Date   LDLCALC 159* 07/16/2014

## 2014-08-13 NOTE — Assessment & Plan Note (Signed)
Ectasia 3.0 cm  F/u duplex next year no palpable mass

## 2014-08-13 NOTE — Patient Instructions (Signed)
Medication Instructions:  INCREASE  LOSARTAN  TO 100 MG EVERY DAY  Labwork: NONE  Testing/Procedures: NONE  Follow-Up:   3 MONTHS  WITH D RNISHAN  Any Other Special Instructions Will Be Listed Below (If Applicable).

## 2014-08-13 NOTE — Assessment & Plan Note (Signed)
Increase cozaar to 100 mg daily f/u Hopper  Low sodium diet and more exercise

## 2014-08-13 NOTE — Assessment & Plan Note (Signed)
No outflow tract murmur increasing BP medication

## 2014-10-20 ENCOUNTER — Other Ambulatory Visit: Payer: Self-pay | Admitting: Urology

## 2014-10-28 ENCOUNTER — Encounter (HOSPITAL_BASED_OUTPATIENT_CLINIC_OR_DEPARTMENT_OTHER): Payer: Self-pay | Admitting: *Deleted

## 2014-10-29 ENCOUNTER — Encounter (HOSPITAL_BASED_OUTPATIENT_CLINIC_OR_DEPARTMENT_OTHER): Payer: Self-pay | Admitting: *Deleted

## 2014-10-29 NOTE — Progress Notes (Signed)
NPO AFTER MN.  ARRIVE AT 1030.  NEEDS ISTAT AND KUB.  CURRENT EKG IN CHART AND EPIC.  WILL TAKE LOSARTAN AND ZANTAC AM DOS W/ SIPS OF WATER.

## 2014-10-30 ENCOUNTER — Ambulatory Visit (HOSPITAL_COMMUNITY): Payer: BLUE CROSS/BLUE SHIELD

## 2014-10-30 ENCOUNTER — Encounter (HOSPITAL_BASED_OUTPATIENT_CLINIC_OR_DEPARTMENT_OTHER)
Admission: RE | Disposition: A | Discharge status: Home / Self Care | Payer: Self-pay | Source: Ambulatory Visit | Attending: Urology

## 2014-10-30 ENCOUNTER — Encounter (HOSPITAL_BASED_OUTPATIENT_CLINIC_OR_DEPARTMENT_OTHER): Payer: Self-pay

## 2014-10-30 ENCOUNTER — Ambulatory Visit (HOSPITAL_BASED_OUTPATIENT_CLINIC_OR_DEPARTMENT_OTHER): Payer: BLUE CROSS/BLUE SHIELD | Admitting: Anesthesiology

## 2014-10-30 ENCOUNTER — Ambulatory Visit (HOSPITAL_BASED_OUTPATIENT_CLINIC_OR_DEPARTMENT_OTHER)
Admission: RE | Admit: 2014-10-30 | Discharge: 2014-10-30 | Disposition: A | Discharge status: Home / Self Care | Payer: BLUE CROSS/BLUE SHIELD | Source: Ambulatory Visit | Attending: Urology | Admitting: Urology

## 2014-10-30 DIAGNOSIS — M1990 Unspecified osteoarthritis, unspecified site: Secondary | ICD-10-CM | POA: Insufficient documentation

## 2014-10-30 DIAGNOSIS — Z7952 Long term (current) use of systemic steroids: Secondary | ICD-10-CM | POA: Insufficient documentation

## 2014-10-30 DIAGNOSIS — Z791 Long term (current) use of non-steroidal anti-inflammatories (NSAID): Secondary | ICD-10-CM | POA: Insufficient documentation

## 2014-10-30 DIAGNOSIS — N132 Hydronephrosis with renal and ureteral calculous obstruction: Secondary | ICD-10-CM | POA: Insufficient documentation

## 2014-10-30 DIAGNOSIS — N201 Calculus of ureter: Secondary | ICD-10-CM | POA: Diagnosis present

## 2014-10-30 DIAGNOSIS — I10 Essential (primary) hypertension: Secondary | ICD-10-CM | POA: Diagnosis not present

## 2014-10-30 DIAGNOSIS — I739 Peripheral vascular disease, unspecified: Secondary | ICD-10-CM | POA: Diagnosis not present

## 2014-10-30 DIAGNOSIS — Z79899 Other long term (current) drug therapy: Secondary | ICD-10-CM | POA: Diagnosis not present

## 2014-10-30 DIAGNOSIS — K219 Gastro-esophageal reflux disease without esophagitis: Secondary | ICD-10-CM | POA: Insufficient documentation

## 2014-10-30 DIAGNOSIS — Z87891 Personal history of nicotine dependence: Secondary | ICD-10-CM | POA: Insufficient documentation

## 2014-10-30 HISTORY — DX: Calculus of ureter: N20.1

## 2014-10-30 HISTORY — DX: Presence of spectacles and contact lenses: Z97.3

## 2014-10-30 HISTORY — DX: Personal history of adenomatous and serrated colon polyps: Z86.0101

## 2014-10-30 HISTORY — PX: CYSTOSCOPY WITH RETROGRADE PYELOGRAM, URETEROSCOPY AND STENT PLACEMENT: SHX5789

## 2014-10-30 HISTORY — DX: Personal history of urinary calculi: Z87.442

## 2014-10-30 HISTORY — DX: Personal history of colonic polyps: Z86.010

## 2014-10-30 HISTORY — DX: Interstitial cystitis (chronic) without hematuria: N30.10

## 2014-10-30 HISTORY — DX: Calculus of kidney: N20.0

## 2014-10-30 HISTORY — PX: HOLMIUM LASER APPLICATION: SHX5852

## 2014-10-30 HISTORY — DX: Diverticulosis of large intestine without perforation or abscess without bleeding: K57.30

## 2014-10-30 HISTORY — DX: Other cervical disc degeneration, unspecified cervical region: M50.30

## 2014-10-30 HISTORY — DX: Personal history of other diseases of male genital organs: Z87.438

## 2014-10-30 HISTORY — DX: Gastro-esophageal reflux disease without esophagitis: K21.9

## 2014-10-30 HISTORY — DX: Other seasonal allergic rhinitis: J30.2

## 2014-10-30 LAB — POCT I-STAT 4, (NA,K, GLUC, HGB,HCT)
Glucose, Bld: 115 mg/dL — ABNORMAL HIGH (ref 65–99)
HCT: 42 % (ref 39.0–52.0)
Hemoglobin: 14.3 g/dL (ref 13.0–17.0)
Potassium: 4 mmol/L (ref 3.5–5.1)
Sodium: 139 mmol/L (ref 135–145)

## 2014-10-30 SURGERY — CYSTOURETEROSCOPY, WITH RETROGRADE PYELOGRAM AND STENT INSERTION
Anesthesia: General | Site: Ureter | Laterality: Left

## 2014-10-30 MED ORDER — MIDAZOLAM HCL 5 MG/5ML IJ SOLN
INTRAMUSCULAR | Status: DC | PRN
  Administered 2014-10-30 (×2): 1 mg via INTRAVENOUS

## 2014-10-30 MED ORDER — PHENAZOPYRIDINE HCL 200 MG PO TABS
200.0000 mg | ORAL_TABLET | Freq: Once | ORAL | Status: AC
  Administered 2014-10-30: 200 mg via ORAL
  Filled 2014-10-30: qty 1

## 2014-10-30 MED ORDER — DEXAMETHASONE SODIUM PHOSPHATE 10 MG/ML IJ SOLN
INTRAMUSCULAR | Status: DC | PRN
  Administered 2014-10-30: 10 mg via INTRAVENOUS

## 2014-10-30 MED ORDER — EPHEDRINE SULFATE 50 MG/ML IJ SOLN
INTRAMUSCULAR | Status: DC | PRN
  Administered 2014-10-30: 15 mg via INTRAVENOUS
  Administered 2014-10-30 (×2): 10 mg via INTRAVENOUS

## 2014-10-30 MED ORDER — CIPROFLOXACIN IN D5W 200 MG/100ML IV SOLN
200.0000 mg | INTRAVENOUS | Status: AC
  Administered 2014-10-30: 200 mg via INTRAVENOUS
  Filled 2014-10-30: qty 100

## 2014-10-30 MED ORDER — IOHEXOL 350 MG/ML SOLN
INTRAVENOUS | Status: DC | PRN
  Administered 2014-10-30: 35 mL
  Administered 2014-10-30: 18 mL

## 2014-10-30 MED ORDER — PHENAZOPYRIDINE HCL 200 MG PO TABS
200.0000 mg | ORAL_TABLET | Freq: Three times a day (TID) | ORAL | Status: DC | PRN
Start: 2014-10-30 — End: 2014-12-01

## 2014-10-30 MED ORDER — PHENAZOPYRIDINE HCL 100 MG PO TABS
ORAL_TABLET | ORAL | Status: AC
  Filled 2014-10-30: qty 2

## 2014-10-30 MED ORDER — OXYCODONE HCL 10 MG PO TABS: 10.0000 mg | ORAL_TABLET | ORAL | Status: DC | PRN

## 2014-10-30 MED ORDER — FENTANYL CITRATE (PF) 100 MCG/2ML IJ SOLN
INTRAMUSCULAR | Status: DC | PRN
  Administered 2014-10-30 (×5): 25 ug via INTRAVENOUS
  Administered 2014-10-30: 12.5 ug via INTRAVENOUS
  Administered 2014-10-30: 25 ug via INTRAVENOUS
  Administered 2014-10-30 (×5): 12.5 ug via INTRAVENOUS
  Administered 2014-10-30: 25 ug via INTRAVENOUS
  Administered 2014-10-30: 50 ug via INTRAVENOUS
  Administered 2014-10-30: 25 ug via INTRAVENOUS
  Administered 2014-10-30 (×2): 12.5 ug via INTRAVENOUS
  Administered 2014-10-30 (×2): 25 ug via INTRAVENOUS

## 2014-10-30 MED ORDER — TAMSULOSIN HCL 0.4 MG PO CAPS
0.4000 mg | ORAL_CAPSULE | Freq: Once | ORAL | Status: AC
  Administered 2014-10-30: 0.4 mg via ORAL
  Filled 2014-10-30: qty 1

## 2014-10-30 MED ORDER — SODIUM CHLORIDE 0.9 % IR SOLN
Status: DC | PRN
  Administered 2014-10-30: 3000 mL
  Administered 2014-10-30: 3000 mL via INTRAVESICAL
  Administered 2014-10-30: 1000 mL

## 2014-10-30 MED ORDER — FENTANYL CITRATE (PF) 100 MCG/2ML IJ SOLN
INTRAMUSCULAR | Status: AC
  Filled 2014-10-30: qty 6

## 2014-10-30 MED ORDER — PROMETHAZINE HCL 25 MG/ML IJ SOLN
6.2500 mg | INTRAMUSCULAR | Status: DC | PRN
  Filled 2014-10-30: qty 1

## 2014-10-30 MED ORDER — KETOROLAC TROMETHAMINE 30 MG/ML IJ SOLN
INTRAMUSCULAR | Status: DC | PRN
  Administered 2014-10-30: 30 mg via INTRAVENOUS

## 2014-10-30 MED ORDER — MIDAZOLAM HCL 2 MG/2ML IJ SOLN
INTRAMUSCULAR | Status: AC
  Filled 2014-10-30: qty 2

## 2014-10-30 MED ORDER — ONDANSETRON HCL 4 MG/2ML IJ SOLN
INTRAMUSCULAR | Status: DC | PRN
  Administered 2014-10-30: 4 mg via INTRAVENOUS

## 2014-10-30 MED ORDER — FENTANYL CITRATE (PF) 100 MCG/2ML IJ SOLN
INTRAMUSCULAR | Status: AC
  Filled 2014-10-30: qty 2

## 2014-10-30 MED ORDER — LIDOCAINE HCL (CARDIAC) 20 MG/ML IV SOLN
INTRAVENOUS | Status: DC | PRN
  Administered 2014-10-30: 100 mg via INTRAVENOUS

## 2014-10-30 MED ORDER — ACETAMINOPHEN 10 MG/ML IV SOLN
INTRAVENOUS | Status: DC | PRN
  Administered 2014-10-30: 1000 mg via INTRAVENOUS

## 2014-10-30 MED ORDER — FENTANYL CITRATE (PF) 100 MCG/2ML IJ SOLN
25.0000 ug | INTRAMUSCULAR | Status: DC | PRN
  Filled 2014-10-30: qty 1

## 2014-10-30 MED ORDER — TAMSULOSIN HCL 0.4 MG PO CAPS
ORAL_CAPSULE | ORAL | Status: AC
  Filled 2014-10-30: qty 1

## 2014-10-30 MED ORDER — GLYCOPYRROLATE 0.2 MG/ML IJ SOLN
INTRAMUSCULAR | Status: DC | PRN
  Administered 2014-10-30: 0.2 mg via INTRAVENOUS

## 2014-10-30 MED ORDER — PROPOFOL 10 MG/ML IV BOLUS
INTRAVENOUS | Status: DC | PRN
  Administered 2014-10-30: 200 mg via INTRAVENOUS

## 2014-10-30 MED ORDER — LACTATED RINGERS IV SOLN
INTRAVENOUS | Status: DC
  Administered 2014-10-30 (×4): via INTRAVENOUS
  Filled 2014-10-30: qty 1000

## 2014-10-30 MED ORDER — LIDOCAINE HCL 2 % EX GEL
CUTANEOUS | Status: DC | PRN
Start: 2014-10-30 — End: 2014-10-30
  Administered 2014-10-30: 1

## 2014-10-30 SURGICAL SUPPLY — 41 items
ADAPTER CATH URET PLST 4-6FR (CATHETERS) IMPLANT
ADPR CATH URET STRL DISP 4-6FR (CATHETERS)
BAG DRAIN URO-CYSTO SKYTR STRL (DRAIN) ×2 IMPLANT
BAG DRN UROCATH (DRAIN) ×1
BASKET LASER NITINOL 1.9FR (BASKET) IMPLANT
BASKET STNLS GEMINI 4WIRE 3FR (BASKET) IMPLANT
BASKET STONE 1.7 NGAGE (UROLOGICAL SUPPLIES) ×1 IMPLANT
BASKET ZERO TIP NITINOL 2.4FR (BASKET) ×1 IMPLANT
BSKT STON RTRVL 120 1.9FR (BASKET)
BSKT STON RTRVL GEM 120X11 3FR (BASKET)
BSKT STON RTRVL ZERO TP 2.4FR (BASKET) ×1
CANISTER SUCT LVC 12 LTR MEDI- (MISCELLANEOUS) IMPLANT
CATH CLEAR GEL 3F BACKSTOP (CATHETERS) IMPLANT
CATH INTERMIT  6FR 70CM (CATHETERS) ×1 IMPLANT
CATH URET 5FR 28IN CONE TIP (BALLOONS)
CATH URET 5FR 70CM CONE TIP (BALLOONS) IMPLANT
CLOTH BEACON ORANGE TIMEOUT ST (SAFETY) ×2 IMPLANT
ELECT REM PT RETURN 9FT ADLT (ELECTROSURGICAL)
ELECTRODE REM PT RTRN 9FT ADLT (ELECTROSURGICAL) IMPLANT
FIBER LASER FLEXIVA 365 (UROLOGICAL SUPPLIES) IMPLANT
FIBER LASER FLEXIVA 550 (UROLOGICAL SUPPLIES) IMPLANT
FIBER LASER TRAC TIP (UROLOGICAL SUPPLIES) ×3 IMPLANT
GLOVE BIO SURGEON STRL SZ8 (GLOVE) ×2 IMPLANT
GOWN STRL REUS W/ TWL LRG LVL3 (GOWN DISPOSABLE) ×1 IMPLANT
GOWN STRL REUS W/ TWL XL LVL3 (GOWN DISPOSABLE) ×1 IMPLANT
GOWN STRL REUS W/TWL LRG LVL3 (GOWN DISPOSABLE)
GOWN STRL REUS W/TWL XL LVL3 (GOWN DISPOSABLE) ×2
GUIDEWIRE 0.038 PTFE COATED (WIRE) IMPLANT
GUIDEWIRE ANG ZIPWIRE 038X150 (WIRE) ×2 IMPLANT
GUIDEWIRE STR DUAL SENSOR (WIRE) ×3 IMPLANT
IV NS IRRIG 3000ML ARTHROMATIC (IV SOLUTION) ×5 IMPLANT
KIT BALLIN UROMAX 15FX10 (LABEL) IMPLANT
KIT BALLN UROMAX 15FX4 (MISCELLANEOUS) IMPLANT
KIT BALLN UROMAX 26 75X4 (MISCELLANEOUS)
MANIFOLD NEPTUNE II (INSTRUMENTS) ×1 IMPLANT
PACK CYSTO (CUSTOM PROCEDURE TRAY) ×2 IMPLANT
SET HIGH PRES BAL DIL (LABEL)
SHEATH ACCESS URETERAL 24CM (SHEATH) ×1 IMPLANT
SHEATH ACCESS URETERAL 38CM (SHEATH) ×2 IMPLANT
STENT URET 6FRX24 CONTOUR (STENTS) ×1 IMPLANT
WATER STERILE IRR 3000ML UROMA (IV SOLUTION) IMPLANT

## 2014-10-30 NOTE — Anesthesia Preprocedure Evaluation (Signed)
Anesthesia Evaluation  Patient identified by MRN, date of birth, ID band Patient awake    Reviewed: Allergy & Precautions, NPO status , Patient's Chart, lab work & pertinent test results  Airway Mallampati: II  TM Distance: >3 FB Neck ROM: Full    Dental no notable dental hx.    Pulmonary former smoker,  breath sounds clear to auscultation  Pulmonary exam normal       Cardiovascular hypertension, + Peripheral Vascular Disease Normal cardiovascular examRhythm:Regular Rate:Normal     Neuro/Psych negative neurological ROS  negative psych ROS   GI/Hepatic Neg liver ROS, GERD-  Medicated,  Endo/Other  negative endocrine ROS  Renal/GU Renal disease  negative genitourinary   Musculoskeletal  (+) Arthritis -,   Abdominal   Peds negative pediatric ROS (+)  Hematology negative hematology ROS (+)   Anesthesia Other Findings   Reproductive/Obstetrics negative OB ROS                             Anesthesia Physical Anesthesia Plan  ASA: II  Anesthesia Plan: General   Post-op Pain Management:    Induction: Intravenous  Airway Management Planned: LMA  Additional Equipment:   Intra-op Plan:   Post-operative Plan: Extubation in OR  Informed Consent: I have reviewed the patients History and Physical, chart, labs and discussed the procedure including the risks, benefits and alternatives for the proposed anesthesia with the patient or authorized representative who has indicated his/her understanding and acceptance.   Dental advisory given  Plan Discussed with: CRNA  Anesthesia Plan Comments:         Anesthesia Quick Evaluation

## 2014-10-30 NOTE — Anesthesia Postprocedure Evaluation (Addendum)
  Anesthesia Post-op Note  Patient: Timothy [name]  Procedure(s) Performed: Procedure(s) (LRB): CYSTO, LEFT RETROGRADE PYELOGRAM, URETEROSCOPY AND STENT PLACEMENT (Left) LEFT HOLMIUM LASER LITHO (Left)  Patient Location: PACU  Anesthesia Type: general  Level of Consciousness: awake and alert   Airway and Oxygen Therapy: Patient Spontanous Breathing  Post-op Pain: mild  Post-op Assessment: Post-op Vital signs reviewed, Patient's Cardiovascular Status Stable, Respiratory Function Stable, Patent Airway and No signs of Nausea or vomiting  Last Vitals:  Filed Vitals:   10/30/14 1525  BP: 121/69  Pulse: 76  Temp:   Resp: 13    Post-op Vital Signs: stable   Complications: No apparent anesthesia complications

## 2014-10-30 NOTE — Op Note (Signed)
PATIENT:  Timothy [name]  PRE-OPERATIVE DIAGNOSIS:  left Ureteral calcului  POST-OPERATIVE DIAGNOSIS: Same  PROCEDURE:  1. Cystoscopy with left retrograde pyelogram including interpretation. 2. Left ureteroscopy and laser lithotripsy of 3 separate and distinct stones. These were in 3 separate and distinct locations. 3. Left double-J stent placement  SURGEON: Claybon Jabs, MD  INDICATION: Mr. Shepherd is a 64 year old male with at least 4 very large left ureteral calculi within the ureter. We have discussed the treatment options and he is going to proceed with ureteroscopic management.  ANESTHESIA:  General  EBL:  less than 50 mL  DRAINS: 6 French, 24's 24 cm double-J stent in the left ureter (no string)   SPECIMEN:  Stone taken to my office for composition analysis.  DESCRIPTION OF PROCEDURE: The patient was taken to the major OR and placed on the table. General anesthesia was administered and then the patient was moved to the dorsal lithotomy position. The genitalia was sterilely prepped and draped. An official timeout was performed.  Initially the 74  French cystoscope with 30 lens was passed under direct vision. The bladder was then entered and fully inspected. It was noted be free of any tumors stones or inflammatory lesions. Ureteral orifices were of normal configuration and position. A 6 French open-ended ureteral catheter was then passed through the cystoscope into the ureteral orifice in order to perform a left retrograde pyelogram.  A retrograde pyelogram was performed by injecting full-strength contrast up the left ureter under direct fluoroscopic control. It revealed the filling defects in the ureter consistent with the stones seen on the preoperative KUB and CT scan. The remainder of the ureter was noted to be normal as was the intrarenal collecting system. I then passed a 0.038 inch floppy-tipped guidewire through the open ended catheter and into the area of the renal  pelvis and this was left in place. The inner portion of a ureteral access sheath was then passed over the guidewire to gently dilate the intramural ureter. I then proceeded with ureteroscopy.  A 6 French rigid ureteroscope was then passed under direct into the bladder and into the left orifice and up the ureter. The first  stone in the distal ureter  was identified and I felt it was too large to extract and therefore elected to proceed with laser lithotripsy. The 200  holmium laser fiber was used to fragment the stone. I then used the nitinol basket to extract all of the stone fragments and reinspection of the ureter ureteroscopically revealed no further stone fragments and no injury to the ureter. I then reinserted the rigid ureteroscope and advanced it up the ureter to where the second large stone was located. I used the holmium laser to fragment this as well and remove all the fragments. I then passed the rigid ureteroscope up the ureter again but the third stone was located above the iliac vessels and I could not visualize it well with the rigid scope so I passed a second guidewire through the rigid scope and left this in place and backloaded the rigid scope off of the guidewire. A ureteral access sheath was then passed over the guidewire and the inner portion as well as guidewire were then removed with the second guidewire remaining in place as a safety guidewire. The flexible, digital ureteroscope was then passed through the access sheath and I was able to visualize the third very large stone and used the laser to fragment this and then used the basket to extract  all of the fragments. I finally passed the flexible ureteroscope again up the ureter but found that the fourth stone appeared to be somewhat embedded in the sidewall of the ureter and because the stone that was more distal to this appeared to a been present in that location for some time there appeared to be some inflammation/scarring that limited  movement distal to the stone and so I was not able to free the stone from this location using the beak of the scope or with using the basket. Because all of the work that had been done already I felt that it would be best to place a stent and then return to treat this remaining stone at a later date. I therefore left the guidewire in place and remove the flexible ureteroscope and access sheath.  I then backloaded the cystoscope over the guidewire and passed the stent over the guidewire into the area of the renal pelvis. As the guidewire was removed good curl was noted in the renal pelvis. The bladder was drained and the cystoscope was then removed. The patient tolerated the procedure well no intraoperative complications.  PLAN OF CARE: Discharge to home after PACU  PATIENT DISPOSITION:  PACU - hemodynamically stable.

## 2014-10-30 NOTE — Discharge Instructions (Signed)

## 2014-10-30 NOTE — Anesthesia Procedure Notes (Addendum)
Date/Time: 10/30/2014 12:07 PM Performed by: Charleston Ropes C   Procedure Name: LMA Insertion Date/Time: 10/30/2014 12:07 PM Performed by: Justice Rocher Pre-anesthesia Checklist: Patient identified, Emergency Drugs available, Suction available and Patient being monitored Patient Re-evaluated:Patient Re-evaluated prior to inductionOxygen Delivery Method: Circle System Utilized Preoxygenation: Pre-oxygenation with 100% oxygen Intubation Type: IV induction Ventilation: Mask ventilation without difficulty LMA: LMA inserted LMA Size: 5.0 Number of attempts: 1 Airway Equipment and Method: bite block Placement Confirmation: positive ETCO2 Tube secured with: Tape Dental Injury: Teeth and Oropharynx as per pre-operative assessment

## 2014-10-30 NOTE — Transfer of Care (Signed)
Immediate Anesthesia Transfer of Care Note  Patient: Timothy [name]  Procedure(s) Performed: Procedure(s) (LRB): CYSTO, LEFT RETROGRADE PYELOGRAM, URETEROSCOPY AND STENT PLACEMENT (Left) LEFT HOLMIUM LASER LITHO (Left)  Patient Location: PACU  Anesthesia Type: General  Level of Consciousness: awake, sedated, patient cooperative and responds to stimulation  Airway & Oxygen Therapy: Patient Spontanous Breathing and Patient connected to face mask oxygen  Post-op Assessment: Report given to PACU RN, Post -op Vital signs reviewed and stable and Patient moving all extremities  Post vital signs: Reviewed and stable  Complications: No apparent anesthesia complications

## 2014-10-30 NOTE — H&P (Signed)
Timothy [name] is a 64 year old male    History of Present Illness         Interstitial cystitis/prostatitis: He has been seen in the past for interstitial cystitis by Dr. Terance [name] and also has had difficulty with recurrent prostatitis previously. He had one episode of hemorrhagic prostatitis.     Elevated PSA: He had a single episode of an acute PSA elevation in 3/05. He had a PSA of 0.6 and then it was noted to have elevated to 5.11. He was treated with antibiotics as it was felt to most likely to be secondary to prostatitis and in fact that appeared to be the case as his repeat PSA after completing antibiotic therapy was 0.74.    Calculus disease: Renal ultrasound in 4/12 revealed bilateral, nonobstructing renal calculi. At that time he had a 5 mm right ureteral calculus. We discussed the options and he elected to proceed with lithotripsy but then canceled.    Gross hematuria: He experienced gross hematuria associated with urgency and some pain in the penis which resolved.    Interval history: He is doing well without any significant flank pain or further hematuria.   Past Medical History Problems  1. History of Acute cystitis without hematuria (N30.00)  Surgical History Problems  1. History of Gallbladder Surgery 2. History of Knee Surgery  Current Meds 1. Aleve 220 MG Oral Capsule;  Therapy: (Recorded:17May2016) to Recorded 2. Losartan Potassium 100 MG Oral Tablet;  Therapy: (Recorded:17May2016) to Recorded 3. Magnesium CAPS;  Therapy: (Recorded:17May2016) to Recorded 4. Multi-Day TABS;  Therapy: (Recorded:17May2016) to Recorded 5. PredniSONE 50 MG Oral Tablet; take 50 mg 1 po 13, 7, and 1 hour prior to ct;  Therapy: [id number] to (Last Rx:17May2016)  Requested for: [id number] Ordered  Allergies Medication  1. Cipro TABS 2. Doxy-Caps CAPS 3. Sulfamethoxazole-TMP DS TABS 4. Blue Dyes 5. Penicillins Non-Medication  6. Contrast Dye  Family History Problems  1.  Family history of malignant neoplasm of urinary bladder (Z80.52) : Mother, Uncle  Social History Problems  1. Alcohol Use   occassionally 2. Former smoker [phone])   quit 2 years ago 3. Marital History - Currently Married 4. Tobacco Use   1/2 ppd  Review of Systems Genitourinary, constitutional, skin, eye, otolaryngeal, hematologic/lymphatic, cardiovascular, pulmonary, endocrine, musculoskeletal, gastrointestinal, neurological and psychiatric system(s) were reviewed and pertinent findings if present are noted and are otherwise negative.  Genitourinary: dysuria and hematuria.  Neurological: headache.  Vitals Vital Signs   Height: 5 ft 10 in Weight: 190 lb  BMI Calculated: 27.26 BSA Calculated: 2.04 Blood Pressure: 159 / 92 Heart Rate: 86    Physical Exam Constitutional: Well nourished and well developed . No acute distress.   ENT:. The ears and nose are normal in appearance.   Neck: The appearance of the neck is normal and no neck mass is present.   Pulmonary: No respiratory distress and normal respiratory rhythm and effort.   Cardiovascular: Heart rate and rhythm are normal . No peripheral edema.   Abdomen: The abdomen is soft and nontender. No masses are palpated. No CVA tenderness. No hernias are palpable. No hepatosplenomegaly noted.   Lymphatics: The femoral and inguinal nodes are not enlarged or tender.   Skin: Normal skin turgor, no visible rash and no visible skin lesions.   Neuro/Psych:. Mood and affect are appropriate.    AU CT-HEMATURIA PROTOCOL [id number] 12:00AM Timothy [name], Paras  Test Name Result Flag Reference AU CT-HEMATURIA PROTOCOL (Report)   ** RADIOLOGY REPORT BY [facility] **  CLINICAL DATA: Micro hematuria 2 months prior.  EXAM: CT ABDOMEN AND PELVIS WITHOUT AND WITH CONTRAST  TECHNIQUE: Multidetector CT imaging of the abdomen and pelvis was performed following the standard protocol before and following the  bolus administration of intravenous contrast.  CONTRAST: 125 cc Isovue  COMPARISON: None.  FINDINGS: Lower chest: Lung bases are clear.  Hepatobiliary: No focal hepatic lesion. Postcholecystectomy. No biliary dilatation.  Pancreas: Pancreas is normal. No ductal dilatation. No pancreatic inflammation.  Spleen: Normal spleen  Adrenals/urinary tract: Adrenal glands are normal.  Non IV contrast images demonstrate approximately 20 calcifications in the right kidney ranging in size from 2 to is 8 mm. No right ureterolithiasis.  There 6 calculi within the left kidney ranging in size from 2 to 4 mm. There is mild hydroureter on the left.  There an obstructing calculus within the mid left ureter measuring 8 mm in axial dimension on 15 mm in craniocaudad dimension. There is a second calculus just inferior within the left ureter measuring 10 mm on image 61. A third calculus within the mid ureter measuring 5 mm on image 71 (series 300) and finally a 12 mm calculus in the distal left ureter approximately 2 cm from the vascular junction on image 83. These 4 left renal calculi are evident on the CT tomogram.  Cortical phase imaging demonstrates no enhancing renal cortical lesion.  Delayed pyelogram phase imaging demonstrates hydronephrosis of the left renal collecting system and mild hydroureter. No filling defects the collecting systems of than the left ureteral calculi.  No bladder calculi, enhancing bladder lesions, or filling defect within the bladder.  Stomach/Bowel: Stomach, small bowel, appendix, and cecum are normal. The colon and rectosigmoid colon are normal.  Vascular/Lymphatic: Abdominal aorta is normal caliber. There is no retroperitoneal or periportal lymphadenopathy. No pelvic lymphadenopathy.  Reproductive: Prostate gland is normal.  Musculoskeletal: No aggressive osseous lesion.  Other: No free fluid.  IMPRESSION: 1. Four obstructing calculi within the  left ureter with mild to moderate hydronephrosis on the left. 2. Left ureteral calculi are identified on the CT topogram. 3. Bilateral nephrolithiasis, right greater than left.   Electronically Signed  By: Timothy [name] M.D.  On: 10/14/2014 16:10  BUN & CREATININE [id number] 03:33PM Timothy [name], [place] SPECIMEN TYPE: BLOOD  Test Name Result Flag Reference CREATININE 1.02 mg/dL  0.50-1.50 BUN 17 mg/dL  6-23 Est GFR, African American 89 mL/min   Est GFR, NonAfrican American 77 mL/min   THE ESTIMATED GFR IS A CALCULATION VALID FOR ADULTS (>=4 YEARS OLD) THAT USES THE CKD-EPI ALGORITHM TO ADJUST FOR AGE AND SEX. IT IS   NOT TO BE USED FOR CHILDREN, PREGNANT WOMEN, HOSPITALIZED PATIENTS,    PATIENTS ON DIALYSIS, OR WITH RAPIDLY CHANGING KIDNEY FUNCTION. ACCORDING TO THE NKDEP, EGFR >89 IS NORMAL, 60-89 SHOWS MILD IMPAIRMENT, 30-59 SHOWS MODERATE IMPAIRMENT, 15-29 SHOWS SEVERE IMPAIRMENT AND <15 IS ESRD.  Assessment   I went over the results of his CT scan with him. It is revealed at least 20 stones within his right kidney measuring from 2-8 millimeters in size many of which appeared to be associated with the papilla and possibly actually within the collecting ducts. Within the left kidney there are 6 stones ranging from 2-4 mm. There is left hydronephrosis and 4 stones seen within the left ureter. An 8 mm wide by 15 mm long stone in the proximal ureter with Hounsfield units of 1200 a second 10 mm stone in the mid ureter with another stone just distal to that measuring  5 mm and a 12 mm distal stone with Hounsfield units of 1000.    We discussed the management of urinary stones. These options include observation, ureteroscopy, shockwave lithotripsy, and PCNL. We discussed which options are relevant to these particular stones. We discussed the natural history of stones as well as the complications of untreated stones and the impact on quality of life without treatment as well as with each of the  above listed treatments. We also discussed the efficacy of each treatment in its ability to clear the stone burden. With any of these management options I discussed the signs and symptoms of infection and the need for emergent treatment should these be experienced. For each option we discussed the ability of each procedure to clear the patient of their stone burden.    For observation I described the risks which include but are not limited to silent renal damage, life-threatening infection, need for emergent surgery, failure to pass stone, and pain.    For ureteroscopy I described the risks which include heart attack, stroke, pulmonary embolus, death, bleeding, infection, damage to contiguous structures, positioning injury, ureteral stricture, ureteral avulsion, ureteral injury, need for ureteral stent, inability to perform ureteroscopy, need for an interval procedure, inability to clear stone burden, stent discomfort and pain.    For shockwave lithotripsy I described the risks which include arrhythmia, kidney contusion, kidney hemorrhage, need for transfusion, long-term risk of diabetes or hypertension, back discomfort, flank ecchymosis, flank abrasion, inability to break up stone, inability to pass stone fragments, Steinstrasse, infection associated with obstructing stones, need for different surgical procedure and possible need for repeat shockwave lithotripsy.    For PCNL I described the risks including heart attack, sure, pulmonary embolus, death, positioning injury, pneumothorax, hydrothorax, need for chest tube, inability to clear stone burden, renal laceration, arterial venous fistula or malformation, need for embolization of kidney, loss of kidney or renal function, need for repeat procedure, need for prolonged nephrostomy tube, ureteral avulsion and fistula.     I told him that I did not feel he would be a good candidate for lithotripsy in a percutaneous procedure was not indicated. I  therefore have recommended ureteroscopic management. I am going to initiate adequate expulsive therapy but I told him that I thought there was only a very low probability that his stones would pass although this may help prevent pain from occurring. I did make him aware that this may be a staged procedure and that he will have a stent afterwards regardless.    We discussed the fact that cystoscopy today was not necessary since he would be undergoing a cystoscopic evaluation at the time of his ureteroscopy. He was pleased to hear this.   Plan   He is scheduled for cystoscopy, left retrograde pyelogram, left ureteroscopy and laser lithotripsy of his multiple left ureteral calculi.

## 2014-11-03 ENCOUNTER — Encounter (HOSPITAL_BASED_OUTPATIENT_CLINIC_OR_DEPARTMENT_OTHER): Payer: Self-pay | Admitting: Urology

## 2014-11-11 ENCOUNTER — Other Ambulatory Visit: Payer: Self-pay | Admitting: Urology

## 2014-12-01 ENCOUNTER — Encounter (HOSPITAL_BASED_OUTPATIENT_CLINIC_OR_DEPARTMENT_OTHER): Payer: Self-pay | Admitting: *Deleted

## 2014-12-01 NOTE — Progress Notes (Signed)
NPO AFTER MN.  ARRIVE AT [id number].  NEEDS ISTAT AND KUB.  CURRENT EKG IN CHART AND EPIC.  WILL TAKE LOSARTAN AND ZANTAC AM DOS W/ SIPS OF WATER AND IF NEEDED TAKE TRAMADOL.

## 2014-12-04 ENCOUNTER — Ambulatory Visit (HOSPITAL_BASED_OUTPATIENT_CLINIC_OR_DEPARTMENT_OTHER): Payer: BLUE CROSS/BLUE SHIELD | Admitting: Certified Registered"

## 2014-12-04 ENCOUNTER — Encounter (HOSPITAL_BASED_OUTPATIENT_CLINIC_OR_DEPARTMENT_OTHER): Payer: Self-pay | Admitting: *Deleted

## 2014-12-04 ENCOUNTER — Ambulatory Visit (HOSPITAL_BASED_OUTPATIENT_CLINIC_OR_DEPARTMENT_OTHER)
Admission: RE | Admit: 2014-12-04 | Discharge: 2014-12-04 | Disposition: A | Discharge status: Home / Self Care | Payer: BLUE CROSS/BLUE SHIELD | Source: Ambulatory Visit | Attending: Urology | Admitting: Urology

## 2014-12-04 ENCOUNTER — Ambulatory Visit (HOSPITAL_COMMUNITY): Payer: BLUE CROSS/BLUE SHIELD

## 2014-12-04 ENCOUNTER — Encounter (HOSPITAL_BASED_OUTPATIENT_CLINIC_OR_DEPARTMENT_OTHER)
Admission: RE | Disposition: A | Discharge status: Home / Self Care | Payer: Self-pay | Source: Ambulatory Visit | Attending: Urology

## 2014-12-04 DIAGNOSIS — N3010 Interstitial cystitis (chronic) without hematuria: Secondary | ICD-10-CM | POA: Diagnosis not present

## 2014-12-04 DIAGNOSIS — N201 Calculus of ureter: Secondary | ICD-10-CM

## 2014-12-04 DIAGNOSIS — I10 Essential (primary) hypertension: Secondary | ICD-10-CM | POA: Insufficient documentation

## 2014-12-04 DIAGNOSIS — Z87891 Personal history of nicotine dependence: Secondary | ICD-10-CM | POA: Diagnosis not present

## 2014-12-04 DIAGNOSIS — K219 Gastro-esophageal reflux disease without esophagitis: Secondary | ICD-10-CM | POA: Diagnosis not present

## 2014-12-04 HISTORY — PX: CYSTOSCOPY WITH HOLMIUM LASER LITHOTRIPSY: SHX6639

## 2014-12-04 HISTORY — PX: CYSTOSCOPY WITH URETEROSCOPY AND STENT PLACEMENT: SHX6377

## 2014-12-04 HISTORY — PX: STONE EXTRACTION WITH BASKET: SHX5318

## 2014-12-04 HISTORY — PX: CYSTOSCOPY W/ RETROGRADES: SHX1426

## 2014-12-04 LAB — POCT I-STAT 4, (NA,K, GLUC, HGB,HCT)
Glucose, Bld: 99 mg/dL (ref 65–99)
HCT: 39 % (ref 39.0–52.0)
Hemoglobin: 13.3 g/dL (ref 13.0–17.0)
Potassium: 3.9 mmol/L (ref 3.5–5.1)
Sodium: 141 mmol/L (ref 135–145)

## 2014-12-04 SURGERY — CYSTOURETEROSCOPY, WITH STENT INSERTION
Anesthesia: General | Site: Ureter | Laterality: Left

## 2014-12-04 MED ORDER — FENTANYL CITRATE (PF) 100 MCG/2ML IJ SOLN
INTRAMUSCULAR | Status: AC
  Filled 2014-12-04: qty 6

## 2014-12-04 MED ORDER — LIDOCAINE HCL 2 % EX GEL
CUTANEOUS | Status: DC | PRN
  Administered 2014-12-04: 1 via URETHRAL

## 2014-12-04 MED ORDER — TAMSULOSIN HCL 0.4 MG PO CAPS
ORAL_CAPSULE | ORAL | Status: AC
  Filled 2014-12-04: qty 1

## 2014-12-04 MED ORDER — DEXAMETHASONE SODIUM PHOSPHATE 4 MG/ML IJ SOLN
INTRAMUSCULAR | Status: DC | PRN
  Administered 2014-12-04: 8 mg via INTRAVENOUS

## 2014-12-04 MED ORDER — LACTATED RINGERS IV SOLN
INTRAVENOUS | Status: DC
  Administered 2014-12-04 (×2): via INTRAVENOUS
  Filled 2014-12-04: qty 1000

## 2014-12-04 MED ORDER — SODIUM CHLORIDE 0.9 % IR SOLN
Status: DC | PRN
  Administered 2014-12-04: 4000 mL

## 2014-12-04 MED ORDER — LIDOCAINE HCL (CARDIAC) 20 MG/ML IV SOLN
INTRAVENOUS | Status: DC | PRN
  Administered 2014-12-04: 80 mg via INTRAVENOUS

## 2014-12-04 MED ORDER — OXYCODONE HCL 10 MG PO TABS: 10.0000 mg | ORAL_TABLET | ORAL | Status: DC | PRN

## 2014-12-04 MED ORDER — PROPOFOL 10 MG/ML IV BOLUS
INTRAVENOUS | Status: DC | PRN
  Administered 2014-12-04: 170 mg via INTRAVENOUS
  Administered 2014-12-04: 30 mg via INTRAVENOUS

## 2014-12-04 MED ORDER — MIDAZOLAM HCL 5 MG/5ML IJ SOLN
INTRAMUSCULAR | Status: DC | PRN
  Administered 2014-12-04: 2 mg via INTRAVENOUS

## 2014-12-04 MED ORDER — MEPERIDINE HCL 25 MG/ML IJ SOLN
6.2500 mg | INTRAMUSCULAR | Status: DC | PRN
  Filled 2014-12-04: qty 1

## 2014-12-04 MED ORDER — PHENAZOPYRIDINE HCL 100 MG PO TABS
ORAL_TABLET | ORAL | Status: AC
  Filled 2014-12-04: qty 2

## 2014-12-04 MED ORDER — CIPROFLOXACIN IN D5W 200 MG/100ML IV SOLN
INTRAVENOUS | Status: AC
  Filled 2014-12-04: qty 100

## 2014-12-04 MED ORDER — IOHEXOL 350 MG/ML SOLN
INTRAVENOUS | Status: DC | PRN
  Administered 2014-12-04: 10 mL

## 2014-12-04 MED ORDER — PHENAZOPYRIDINE HCL 200 MG PO TABS: 200.0000 mg | ORAL_TABLET | Freq: Three times a day (TID) | ORAL | Status: DC | PRN

## 2014-12-04 MED ORDER — MIDAZOLAM HCL 2 MG/2ML IJ SOLN
INTRAMUSCULAR | Status: AC
  Filled 2014-12-04: qty 2

## 2014-12-04 MED ORDER — CIPROFLOXACIN IN D5W 200 MG/100ML IV SOLN
200.0000 mg | INTRAVENOUS | Status: AC
  Administered 2014-12-04: 200 mg via INTRAVENOUS
  Filled 2014-12-04: qty 100

## 2014-12-04 MED ORDER — TAMSULOSIN HCL 0.4 MG PO CAPS
0.4000 mg | ORAL_CAPSULE | Freq: Once | ORAL | Status: AC
  Administered 2014-12-04: 0.4 mg via ORAL
  Filled 2014-12-04: qty 1

## 2014-12-04 MED ORDER — FENTANYL CITRATE (PF) 100 MCG/2ML IJ SOLN
INTRAMUSCULAR | Status: DC | PRN
  Administered 2014-12-04 (×3): 50 ug via INTRAVENOUS

## 2014-12-04 MED ORDER — EPHEDRINE SULFATE 50 MG/ML IJ SOLN
INTRAMUSCULAR | Status: DC | PRN
  Administered 2014-12-04 (×2): 5 mg via INTRAVENOUS

## 2014-12-04 MED ORDER — ONDANSETRON HCL 4 MG/2ML IJ SOLN
4.0000 mg | Freq: Once | INTRAMUSCULAR | Status: DC | PRN
Start: 2014-12-04 — End: 2014-12-04
  Filled 2014-12-04: qty 2

## 2014-12-04 MED ORDER — PHENAZOPYRIDINE HCL 200 MG PO TABS
200.0000 mg | ORAL_TABLET | Freq: Once | ORAL | Status: AC
  Administered 2014-12-04: 200 mg via ORAL
  Filled 2014-12-04: qty 1

## 2014-12-04 MED ORDER — HYDROMORPHONE HCL 1 MG/ML IJ SOLN
0.2500 mg | INTRAMUSCULAR | Status: DC | PRN
  Filled 2014-12-04: qty 1

## 2014-12-04 MED ORDER — ONDANSETRON HCL 4 MG/2ML IJ SOLN
INTRAMUSCULAR | Status: DC | PRN
  Administered 2014-12-04: 4 mg via INTRAVENOUS

## 2014-12-04 SURGICAL SUPPLY — 45 items
ADAPTER CATH URET PLST 4-6FR (CATHETERS) IMPLANT
ADPR CATH URET STRL DISP 4-6FR (CATHETERS)
BAG DRAIN URO-CYSTO SKYTR STRL (DRAIN) ×2 IMPLANT
BAG DRN UROCATH (DRAIN) ×1
BASKET LASER NITINOL 1.9FR (BASKET) IMPLANT
BASKET STNLS GEMINI 4WIRE 3FR (BASKET) IMPLANT
BASKET ZERO TIP NITINOL 2.4FR (BASKET) ×1 IMPLANT
BSKT STON RTRVL 120 1.9FR (BASKET)
BSKT STON RTRVL GEM 120X11 3FR (BASKET)
BSKT STON RTRVL ZERO TP 2.4FR (BASKET) ×1
CANISTER SUCT LVC 12 LTR MEDI- (MISCELLANEOUS) IMPLANT
CATH INTERMIT  6FR 70CM (CATHETERS) IMPLANT
CATH URET 5FR 28IN CONE TIP (BALLOONS)
CATH URET 5FR 70CM CONE TIP (BALLOONS) IMPLANT
CLOTH BEACON ORANGE TIMEOUT ST (SAFETY) ×2 IMPLANT
ELECT REM PT RETURN 9FT ADLT (ELECTROSURGICAL)
ELECTRODE REM PT RTRN 9FT ADLT (ELECTROSURGICAL) IMPLANT
FIBER LASER FLEXIVA 365 (UROLOGICAL SUPPLIES) IMPLANT
FIBER LASER FLEXIVA 550 (UROLOGICAL SUPPLIES) IMPLANT
FIBER LASER TRAC TIP (UROLOGICAL SUPPLIES) ×1 IMPLANT
GLOVE BIO SURGEON STRL SZ 6.5 (GLOVE) ×1 IMPLANT
GLOVE BIO SURGEON STRL SZ8 (GLOVE) ×2 IMPLANT
GLOVE BIOGEL PI IND STRL 6.5 (GLOVE) IMPLANT
GLOVE BIOGEL PI INDICATOR 6.5 (GLOVE) ×2
GOWN STRL REUS W/ TWL LRG LVL3 (GOWN DISPOSABLE) ×1 IMPLANT
GOWN STRL REUS W/ TWL XL LVL3 (GOWN DISPOSABLE) ×1 IMPLANT
GOWN STRL REUS W/TWL LRG LVL3 (GOWN DISPOSABLE) ×2
GOWN STRL REUS W/TWL XL LVL3 (GOWN DISPOSABLE) ×2
GUIDEWIRE 0.038 PTFE COATED (WIRE) IMPLANT
GUIDEWIRE ANG ZIPWIRE 038X150 (WIRE) IMPLANT
GUIDEWIRE STR DUAL SENSOR (WIRE) ×3 IMPLANT
IV NS 1000ML (IV SOLUTION) ×2
IV NS 1000ML BAXH (IV SOLUTION) IMPLANT
IV NS IRRIG 3000ML ARTHROMATIC (IV SOLUTION) ×3 IMPLANT
KIT BALLIN UROMAX 15FX10 (LABEL) IMPLANT
KIT BALLN UROMAX 15FX4 (MISCELLANEOUS) IMPLANT
KIT BALLN UROMAX 26 75X4 (MISCELLANEOUS)
MANIFOLD NEPTUNE II (INSTRUMENTS) ×1 IMPLANT
NS IRRIG 500ML POUR BTL (IV SOLUTION) ×1 IMPLANT
PACK CYSTO (CUSTOM PROCEDURE TRAY) ×2 IMPLANT
SET HIGH PRES BAL DIL (LABEL)
SHEATH ACCESS URETERAL 38CM (SHEATH) IMPLANT
STENT POLARIS 5FRX24 (STENTS) ×1 IMPLANT
SYRINGE IRR TOOMEY STRL 70CC (SYRINGE) ×1 IMPLANT
WATER STERILE IRR 3000ML UROMA (IV SOLUTION) IMPLANT

## 2014-12-04 NOTE — Discharge Instructions (Signed)
Post stone removal/stent placement surgery instructions   Definitions:  Ureter: The duct that transports urine from the kidney to the bladder. Stent: A plastic hollow tube that is placed into the ureter, from the kidney to the bladder to prevent the ureter from swelling shut.  General instructions:  Despite the fact that no skin incisions were used, the area around the ureter and bladder is raw and irritated. The stent is a foreign body which will further irritate the bladder wall. This irritation is manifested by increased frequency of urination, both day and night, and by an increase in the urge to urinate. In some, the urge to urinate is present almost always. Sometimes the urge is strong enough that you may not be able to stop your self from urinating. The only real cure is to remove the stent and then give time for the bladder wall to heal which can't be done until the danger of the ureter swelling shut has passed. (This varies from 2-21 days).  You may see some blood in your urine while the stent is in place and a few days afterward. Do not be alarmed, even if the urine is clear for a while. Get off your feet and drink lots of fluids until clearing occurs. If you start to pass clots or don't improve, call us.  If you have a string coming from your urethra:  The stent string is attached to your ureteral stent.  Do not pull on thisIf you have a string coming from your urethra:  The stent string is attached to your ureteral stent.  Do not pull on this.  Diet:  You may return to your normal diet immediately. Because of the raw surface of your bladder, alcohol, spicy foods, foods high in acid and drinks with caffeine may cause irritation or frequency and should be used in moderation. To keep your urine flowing freely and avoid constipation, drink plenty of fluids during the day (8-10 glasses). Tip: Avoid cranberry juice because it is very acidic.  Activity:  Your physical activity doesn't need  to be restricted. However, if you are very active, you may see some blood in the urine. We suggest that you reduce your activity under the circumstances until the bleeding has stopped.  Bowels:  It is important to keep your bowels regular during the postoperative period. Straining with bowel movements can cause bleeding. A bowel movement every other day is reasonable. Use a mild laxative if needed, such as milk of magnesia 2-3 tablespoons, or 2 Dulcolax tablets. Call if you continue to have problems. If you had been taking narcotics for pain, before, during or after your surgery, you may be constipated. Take a laxative if necessary.     Medication:  You should resume your pre-surgery medications unless told not to. DO NOT RESUME YOUR ASPIRIN, or any other medicines like ibuprofen, motrin, excedrin, advil, aleve, vitamin E, fish oil as these can all cause bleeding x 7 days. In addition you may be given an antibiotic to prevent or treat infection. Antibiotics are not always necessary. All medication should be taken as prescribed until the bottles are finished unless you are having an unusual reaction to one of the drugs.  Problems you should report to [place]:  a. Fever greater than 101F. b. Heavy bleeding, or clots (see notes above about blood in urine). c. Inability to urinate. d. Drug reactions (hives, rash, nausea, vomiting, diarrhea). e. Severe burning or pain with urination that is not improving.  Followup:  You will need a followup appointment to monitor your progress in most cases. Please call the office for this appointment when you get home if your appointment has not already been scheduled. Usually the first appointment will be about 5-14 days after your surgery and if you have a stent in place it will likely be removed at that time.  Post Anesthesia Home Care Instructions  Activity: Get plenty of rest for the remainder of the day. A responsible adult should stay with you for 24  hours following the procedure.  For the next 24 hours, DO NOT: -Drive a car -Paediatric nurse -Drink alcoholic beverages -Take any medication unless instructed by your physician -Make any legal decisions or sign important papers.  Meals: Start with liquid foods such as gelatin or soup. Progress to regular foods as tolerated. Avoid greasy, spicy, heavy foods. If nausea and/or vomiting occur, drink only clear liquids until the nausea and/or vomiting subsides. Call your physician if vomiting continues.  Special Instructions/Symptoms: Your throat may feel dry or sore from the anesthesia or the breathing tube placed in your throat during surgery. If this causes discomfort, gargle with warm salt water. The discomfort should disappear within 24 hours.

## 2014-12-04 NOTE — OR Nursing (Signed)
Left ureteral stone taking by Dr. Karsten Ro.

## 2014-12-04 NOTE — Anesthesia Postprocedure Evaluation (Signed)
Anesthesia Post Note  Patient: Timothy [name]  Procedure(s) Performed: Procedure(s) (LRB): CYSTOSCOPY WITH URETEROSCOPY WITH STENT REPLACEMENT (Left) CYSTOSCOPY WITH HOLMIUM LASER LITHOTRIPSY (Left) STONE EXTRACTION WITH BASKET (Left)  RETROGRADE PYELOGRAM (Left)  Anesthesia type: general  Patient location: PACU  Post pain: Pain level controlled  Post assessment: Patient's Cardiovascular Status Stable  Last Vitals:  Filed Vitals:   12/04/14 1145  BP: 145/88  Pulse:   Temp:   Resp: 10    Post vital signs: Reviewed and stable  Level of consciousness: sedated  Complications: No apparent anesthesia complications

## 2014-12-04 NOTE — Transfer of Care (Signed)
Immediate Anesthesia Transfer of Care Note  Patient: Timothy [name]  Procedure(s) Performed: Procedure(s) (LRB): CYSTOSCOPY WITH URETEROSCOPY WITH STENT REPLACEMENT (Left) CYSTOSCOPY WITH HOLMIUM LASER LITHOTRIPSY (Left) STONE EXTRACTION WITH BASKET (Left)  RETROGRADE PYELOGRAM (Left)  Patient Location: PACU  Anesthesia Type: General  Level of Consciousness: awake, oriented, sedated and patient cooperative  Airway & Oxygen Therapy: Patient Spontanous Breathing and Patient connected to face mask oxygen  Post-op Assessment: Report given to PACU RN and Post -op Vital signs reviewed and stable  Post vital signs: Reviewed and stable  Complications: No apparent anesthesia complications

## 2014-12-04 NOTE — Anesthesia Preprocedure Evaluation (Signed)
Anesthesia Evaluation  Patient identified by MRN, date of birth, ID band Patient awake    Reviewed: Allergy & Precautions, NPO status , Patient's Chart, lab work & pertinent test results  Airway Mallampati: I  TM Distance: >3 FB Neck ROM: Full    Dental   Pulmonary former smoker,    Pulmonary exam normal       Cardiovascular hypertension, Pt. on medications Normal cardiovascular exam    Neuro/Psych    GI/Hepatic GERD-  Medicated and Controlled,  Endo/Other    Renal/GU      Musculoskeletal   Abdominal   Peds  Hematology   Anesthesia Other Findings   Reproductive/Obstetrics                             Anesthesia Physical Anesthesia Plan  ASA: II  Anesthesia Plan: General   Post-op Pain Management:    Induction: Intravenous  Airway Management Planned: LMA  Additional Equipment:   Intra-op Plan:   Post-operative Plan: Extubation in OR  Informed Consent: I have reviewed the patients History and Physical, chart, labs and discussed the procedure including the risks, benefits and alternatives for the proposed anesthesia with the patient or authorized representative who has indicated his/her understanding and acceptance.     Plan Discussed with: CRNA and Surgeon  Anesthesia Plan Comments:         Anesthesia Quick Evaluation

## 2014-12-04 NOTE — Op Note (Signed)
PATIENT:  Timothy [name]  PRE-OPERATIVE DIAGNOSIS:  1. Left ureteral stent 2. left Ureteral calculi  POST-OPERATIVE DIAGNOSIS: Same  PROCEDURE:  1. Removal of left ureteral stent 2. Left retrograde pyelogram with interpretation 3. Left ureteroscopy, laser lithotripsy and stone extraction 4. Left ureteral stent placement  SURGEON: Claybon Jabs, MD  INDICATION: Mr. Illescas is a 64 year old male who had an extensive amount of stone material within his left ureter. He previously underwent ureteroscopy and laser lithotripsy with removal of multiple stones from his left ureter although I was unable to completely clear all of the stones and left a stent in place. He returns for completion of his staged procedure.  ANESTHESIA:  General  EBL:  Minimal  DRAINS: 5 French, 24 cm stent (with string)  SPECIMEN:  Stone taken to my office for compositional analysis.  DESCRIPTION OF PROCEDURE: The patient was taken to the major OR and placed on the table. General anesthesia was administered and then the patient was moved to the dorsal lithotomy position. The genitalia was sterilely prepped and draped. An official timeout was performed.  Initially the 67 French cystoscope with 30 lens was passed under direct vision. The bladder was then entered and fully inspected. It was noted be free of any tumors stones or inflammatory lesions. Ureteral orifices were of normal configuration and position with a stent exiting the left ureteral orifice. The alligator graspers were used to grasp the stent and it was drawn out through the urethral meatus. I passed a 0.038 inch floppy tip sensor guidewire through the stent and into the area of the renal pelvis under fluoroscopy and left this in place removing the stent.  Left retrograde pyelogram: I then passed the 6 French rigid ureteroscope into the bladder and into the left ureteral orifice for a short distance. Through the ureteroscope and injected full strength  Omnipaque contrast up the left ureter and was able to identify a filling defect consistent with his known left ureteral stone in the mid ureter. No stones were seen proximal to this and the intrarenal collecting system appeared normal.  I then passed the rigid ureteroscope on up the ureter and was able to identify a stone that appeared small enough to be extracted so I passed a nitinol basket through the ureteroscope, engaged the stone and was able to easily remove the stone from the ureter. I then passed the ureteroscope up the left ureter again, identified another stone that appeared smaller to extract and engaged this stone and removed it as well. I then passed the scope again up the ureter and there was a very large stone that was located on the anterior aspect of the ureter and I was unable to get my scope in a good position to laser this large stone so I passed a second guidewire through the ureteroscope and left this in place and removed the rigid ureteroscope.   I then passed the flexible ureteroscope over the guidewire and up to the stone. The guidewire was removed and a 200  holmium laser fiber was used to fragment the stone. Multiple fragments were produced and I grasped one with the nitinol basket and then switched back to the rigid ureteroscope and passed this up the ureter multiple times removing stone fragments. I then switched back to the flexible scope, lasered more of the stone and then removed more stone fragments with the rigid ureteroscope and this proceeded until all of the stone fragments were removed, inspection of the ureter revealed there was  no injury or perforation. I therefore removed the ureteroscope and left the guidewire in place.   I then backloaded the cystoscope over the guidewire and passed the stent over the guidewire into the area of the renal pelvis. As the guidewire was removed good curl was noted in the renal pelvis. The bladder was drained and the cystoscope was then  removed. 2% lidocaine jelly was instilled in the urethra and a penile clamp was applied. The string on the distal aspect of the stent was affixed to the dorsum of the penis. The patient tolerated the procedure well no intraoperative complications.  PLAN OF CARE: Discharge to home after PACU  PATIENT DISPOSITION:  PACU - hemodynamically stable.

## 2014-12-04 NOTE — Anesthesia Procedure Notes (Signed)
Procedure Name: LMA Insertion Date/Time: 12/04/2014 10:00 AM Performed by: Denna Haggard D Pre-anesthesia Checklist: Patient identified, Emergency Drugs available, Suction available and Patient being monitored Patient Re-evaluated:Patient Re-evaluated prior to inductionOxygen Delivery Method: Circle System Utilized Preoxygenation: Pre-oxygenation with 100% oxygen Intubation Type: IV induction Ventilation: Mask ventilation without difficulty LMA: LMA inserted LMA Size: 4.0 Number of attempts: 1 Airway Equipment and Method: Bite block Placement Confirmation: positive ETCO2 Tube secured with: Tape Dental Injury: Teeth and Oropharynx as per pre-operative assessment

## 2014-12-04 NOTE — H&P (Signed)
History of Present Illness      Interstitial cystitis/prostatitis: He has been seen in the past for interstitial cystitis by Dr. Terance Hart and also has had difficulty with recurrent prostatitis previously. He had one episode of hemorrhagic prostatitis.    Elevated PSA: He had a single episode of an acute PSA elevation in 3/05. He had a PSA of 0.6 and then it was noted to have elevated to 5.11. He was treated with antibiotics as it was felt to most likely to be secondary to prostatitis and in fact that appeared to be the case as his repeat PSA after completing antibiotic therapy was 0.74.    Calculus disease: Renal ultrasound in 4/12 revealed bilateral, nonobstructing renal calculi. At that time he had a 5 mm right ureteral calculus. We discussed the options and he elected to proceed with lithotripsy but then canceled.    Gross hematuria: He experienced gross hematuria associated with urgency and some pain in the penis which resolved.    Interval history: He is doing well with some intermittent hematuria due to the presence of his stent.  He is also having some mild irritative voiding symptoms.  Past Medical History Problems  1. History of Acute cystitis without hematuria (N30.00)  Surgical History Problems  1. History of Gallbladder Surgery 2. History of Knee Surgery  Current Meds 1. Aleve 220 MG Oral Capsule; Therapy: (Recorded:17May2016) to Recorded 2. Losartan Potassium 100 MG Oral Tablet; Therapy: (Recorded:17May2016) to Recorded 3. Magnesium CAPS; Therapy: (Recorded:17May2016) to Recorded 4. Multi-Day TABS; Therapy: (Recorded:17May2016) to Recorded 5. PredniSONE 50 MG Oral Tablet; take 50 mg 1 po 13, 7, and 1 hour prior to ct; Therapy: [id number] to (Last Rx:17May2016) Requested for: [id number] Ordered  Allergies Medication  1. Cipro TABS 2. Doxy-Caps CAPS 3. Sulfamethoxazole-TMP DS TABS 4. Blue Dyes 5. Penicillins Non-Medication  6. Contrast  Dye  Family History Problems  1. Family history of malignant neoplasm of urinary bladder (Z80.52) : Mother, Uncle  Social History Problems  1. Alcohol Use  occassionally 2. Former smoker [id number]  quit 2 years ago 3. Marital History - Currently Married 4. Tobacco Use  1/2 ppd  Review of Systems Genitourinary, constitutional, skin, eye, otolaryngeal, hematologic/lymphatic, cardiovascular, pulmonary, endocrine, musculoskeletal, gastrointestinal, neurological and psychiatric system(s) were reviewed and pertinent findings if present are noted and are otherwise negative.  Genitourinary: dysuria and hematuria.  Neurological: headache.  Vitals Vital Signs  Blood Pressure: 119 / 80 Heart Rate: 66  Physical Exam Constitutional: Well nourished and well developed . No acute distress.   ENT:. The ears and nose are normal in appearance.   Neck: The appearance of the neck is normal and no neck mass is present.   Pulmonary: No respiratory distress and normal respiratory rhythm and effort.   Cardiovascular: Heart rate and rhythm are normal . No peripheral edema.   Abdomen: The abdomen is soft and nontender. No masses are palpated. No CVA tenderness. No hernias are palpable. No hepatosplenomegaly noted.   Lymphatics: The femoral and inguinal nodes are not enlarged or tender.   Skin: Normal skin turgor, no visible rash and no visible skin lesions.   Neuro/Psych:. Mood and affect are appropriate.   Assessment: We had previouslydiscussed the management of urinary stones. These options include observation, ureteroscopy, shockwave lithotripsy, and PCNL. We discussed which options are relevant to these particular stones. We discussed the natural history of stones as well as the complications of untreated stones and the impact on quality of life without treatment as well as  with each of the above listed treatments. We also discussed the efficacy of each treatment in its ability to  clear the stone burden. With any of these management options I discussed the signs and symptoms of infection and the need for emergent treatment should these be experienced. For each option we discussed the ability of each procedure to clear the patient of their stone burden.  For ureteroscopy I described the risks which include heart attack, stroke, pulmonary embolus, death, bleeding, infection, damage to contiguous structures, positioning injury, ureteral stricture, ureteral avulsion, ureteral injury, need for ureteral stent, inability to perform ureteroscopy, need for an interval procedure, inability to clear stone burden, stent discomfort and pain.  His follow-up KUB revealed the stent in good position.  There appeared to be a single stone in the proximal ureter with all other calcifications along the course of the ureter having been removed.    Plan I will plan to perform ureteroscopy and removal of any remaining left ureteral calculi.

## 2014-12-07 ENCOUNTER — Encounter (HOSPITAL_BASED_OUTPATIENT_CLINIC_OR_DEPARTMENT_OTHER): Payer: Self-pay | Admitting: Urology

## 2014-12-29 ENCOUNTER — Ambulatory Visit: Payer: BLUE CROSS/BLUE SHIELD | Admitting: Cardiovascular Disease

## 2015-01-05 ENCOUNTER — Telehealth: Payer: Self-pay | Admitting: Internal Medicine

## 2015-01-05 NOTE — Telephone Encounter (Signed)
Pt with pt he stated that his wife has gotten in touch with Gastro. I instructed the pt to have wife call and make an appt with Dr Linna Darner if a referral is need because pt has not been seen recently.

## 2015-01-05 NOTE — Telephone Encounter (Signed)
Pt's wife is having gal bladder issues and you diagnosed him about ten years ago and he just wants to know who you referred him to back then. Please advise

## 2015-03-07 NOTE — Progress Notes (Signed)
Patient ID: Timothy [name], male   DOB: 10-Aug-1950, 64 y.o.   MRN: [id number] HPI:  64 y.o.  male seen by Dr Stanford Breed in 8/12 and 9/12 for evaluation of abdominal aortic aneurysm. He had an abdominal ultrasound in April 2012 that revealed a focal bulge in the distal abdominal aorta measuring 3 cm in greatest diameter. This is a probable early distal aortic aneurysm. Echo in August of 2012 showed normal LV function and no thoracic aneurysm. Since he was last seen he denies dyspnea, chest pain, palpitations or syncope. Last visit lisinopril stopped due to drowsyness and cozaar started Tolerating this better Has not taken BP at home   [place] 2015 2.7 cm reviewed   Needs f/u 6/17    Working doing Art conservation still   BP elevated  Discussed lifestyle mods  Drinks a beer daily Uses salt Uses Aleve daily  BP elevated last visit told to start taking his ARB in morning  He did not have home readings Microscopic hematuria to see urology Likely kidney stone passed  Had uretal stent removed by [facility] 12/15/14     ROS: Denies fever, malais, weight loss, blurry vision, decreased visual acuity, cough, sputum, SOB, hemoptysis, pleuritic pain, palpitaitons, heartburn, abdominal pain, melena, lower extremity edema, claudication, or rash.  All other systems reviewed and negative  General: Affect appropriate Healthy:  appears stated age 73: normal Neck supple with no adenopathy JVP normal no bruits no thyromegaly Lungs clear with no wheezing and good diaphragmatic motion Heart:  S1/S2 no murmur, no rub, gallop or click PMI normal Abdomen: benighn, BS positve, no tenderness, no AAA no bruit.  No HSM or HJR Distal pulses intact with no bruits No edema Neuro non-focal Skin warm and dry No muscular weakness   Current Outpatient Prescriptions  Medication Sig Dispense Refill  . aspirin EC 81 MG tablet Take 1 tablet (81 mg total) by mouth daily.    . fluticasone (FLONASE) 50 MCG/ACT nasal spray Place  1 spray into the nose 2 (two) times daily as needed for rhinitis. 16 g 5  . losartan (COZAAR) 100 MG tablet Take 100 mg by mouth every morning.  3  . Naproxen Sodium (ALEVE) 220 MG CAPS Take 440 mg by mouth daily.    . ranitidine (ZANTAC) 150 MG tablet Take 150 mg by mouth 2 (two) times daily.       No current facility-administered medications for this visit.    Allergies  Doxycycline; Dye fdc blue; Penicillins; Septra; and Sulfa antibiotics  Electrocardiogram:  NSR rate 71 normal  2015  07/13/14  SR rate 61 LVH  Voltage   Assessment and Plan HTN:  Well controlled.  Continue current medications and low sodium Dash type diet.   AAA:  Only 3.0 cm f/u duplex June 2017 Urology:  Post stent removal on flomax f/u Dr Jeanella Anton

## 2015-03-09 ENCOUNTER — Ambulatory Visit (INDEPENDENT_AMBULATORY_CARE_PROVIDER_SITE_OTHER): Payer: BLUE CROSS/BLUE SHIELD | Admitting: Cardiovascular Disease

## 2015-03-09 ENCOUNTER — Encounter: Payer: Self-pay | Admitting: Cardiovascular Disease

## 2015-03-09 VITALS — BP 136/88 | HR 59 | Ht 70.0 in | Wt 191.8 lb

## 2015-03-09 DIAGNOSIS — I714 Abdominal aortic aneurysm, without rupture, unspecified: Secondary | ICD-10-CM

## 2015-03-09 NOTE — Patient Instructions (Signed)
Medication Instructions:  Your physician recommends that you continue on your current medications as directed. Please refer to the Current Medication list given to you today.   Labwork: NONE  Testing/Procedures: Your physician has requested that you have an abdominal aorta duplex. During this test, an ultrasound is used to evaluate the aorta. Allow 30 minutes for this exam. Do not eat after midnight the day before and avoid carbonated beverages  DUE IN  JUNE  Follow-Up: Your physician wants you to follow-up in: June WITH  DR  Johnsie Cancel   [facility]  [place] will receive a reminder letter in the mail two months in advance. If you don't receive a letter, please call our office to schedule the follow-up appointment.  Any Other Special Instructions Will Be Listed Below (If Applicable).     If you need a refill on your cardiac medications before your next appointment, please call your pharmacy.

## 2015-05-24 DIAGNOSIS — H2513 Age-related nuclear cataract, bilateral: Secondary | ICD-10-CM | POA: Diagnosis not present

## 2015-05-24 DIAGNOSIS — H25013 Cortical age-related cataract, bilateral: Secondary | ICD-10-CM | POA: Diagnosis not present

## 2015-06-17 DIAGNOSIS — D126 Benign neoplasm of colon, unspecified: Secondary | ICD-10-CM | POA: Diagnosis not present

## 2015-06-17 DIAGNOSIS — Z6828 Body mass index (BMI) 28.0-28.9, adult: Secondary | ICD-10-CM | POA: Diagnosis not present

## 2015-06-17 DIAGNOSIS — E7800 Pure hypercholesterolemia, unspecified: Secondary | ICD-10-CM | POA: Diagnosis not present

## 2015-06-17 DIAGNOSIS — N200 Calculus of kidney: Secondary | ICD-10-CM | POA: Diagnosis not present

## 2015-06-17 DIAGNOSIS — I714 Abdominal aortic aneurysm, without rupture: Secondary | ICD-10-CM | POA: Diagnosis not present

## 2015-06-17 DIAGNOSIS — Z1389 Encounter for screening for other disorder: Secondary | ICD-10-CM | POA: Diagnosis not present

## 2015-06-17 DIAGNOSIS — I10 Essential (primary) hypertension: Secondary | ICD-10-CM | POA: Diagnosis not present

## 2015-08-11 DIAGNOSIS — N390 Urinary tract infection, site not specified: Secondary | ICD-10-CM | POA: Diagnosis not present

## 2015-08-11 DIAGNOSIS — I10 Essential (primary) hypertension: Secondary | ICD-10-CM | POA: Diagnosis not present

## 2015-08-11 DIAGNOSIS — R8299 Other abnormal findings in urine: Secondary | ICD-10-CM | POA: Diagnosis not present

## 2015-08-11 DIAGNOSIS — Z125 Encounter for screening for malignant neoplasm of prostate: Secondary | ICD-10-CM | POA: Diagnosis not present

## 2015-08-18 DIAGNOSIS — Z1212 Encounter for screening for malignant neoplasm of rectum: Secondary | ICD-10-CM | POA: Diagnosis not present

## 2015-08-24 DIAGNOSIS — N200 Calculus of kidney: Secondary | ICD-10-CM | POA: Diagnosis not present

## 2015-08-24 DIAGNOSIS — E7800 Pure hypercholesterolemia, unspecified: Secondary | ICD-10-CM | POA: Diagnosis not present

## 2015-08-24 DIAGNOSIS — R739 Hyperglycemia, unspecified: Secondary | ICD-10-CM | POA: Diagnosis not present

## 2015-08-24 DIAGNOSIS — I714 Abdominal aortic aneurysm, without rupture: Secondary | ICD-10-CM | POA: Diagnosis not present

## 2015-08-24 DIAGNOSIS — Z6828 Body mass index (BMI) 28.0-28.9, adult: Secondary | ICD-10-CM | POA: Diagnosis not present

## 2015-08-24 DIAGNOSIS — I10 Essential (primary) hypertension: Secondary | ICD-10-CM | POA: Diagnosis not present

## 2015-08-24 DIAGNOSIS — Z0000 Encounter for general adult medical examination without abnormal findings: Secondary | ICD-10-CM | POA: Diagnosis not present

## 2015-08-24 DIAGNOSIS — Z23 Encounter for immunization: Secondary | ICD-10-CM | POA: Diagnosis not present

## 2015-08-24 DIAGNOSIS — D126 Benign neoplasm of colon, unspecified: Secondary | ICD-10-CM | POA: Diagnosis not present

## 2015-08-27 DIAGNOSIS — W57XXXA Bitten or stung by nonvenomous insect and other nonvenomous arthropods, initial encounter: Secondary | ICD-10-CM | POA: Diagnosis not present

## 2015-08-27 DIAGNOSIS — L259 Unspecified contact dermatitis, unspecified cause: Secondary | ICD-10-CM | POA: Diagnosis not present

## 2015-09-18 IMAGING — CR DG ABDOMEN 1V
1 series · 1 of 1 positions shown · non-contrast
Comparison: CT scan of October 14, 2014. Radiograph August 23, 2010.

CLINICAL DATA: Left flank pain for several days.

EXAM:
ABDOMEN - 1 VIEW

[t abdomen supine]
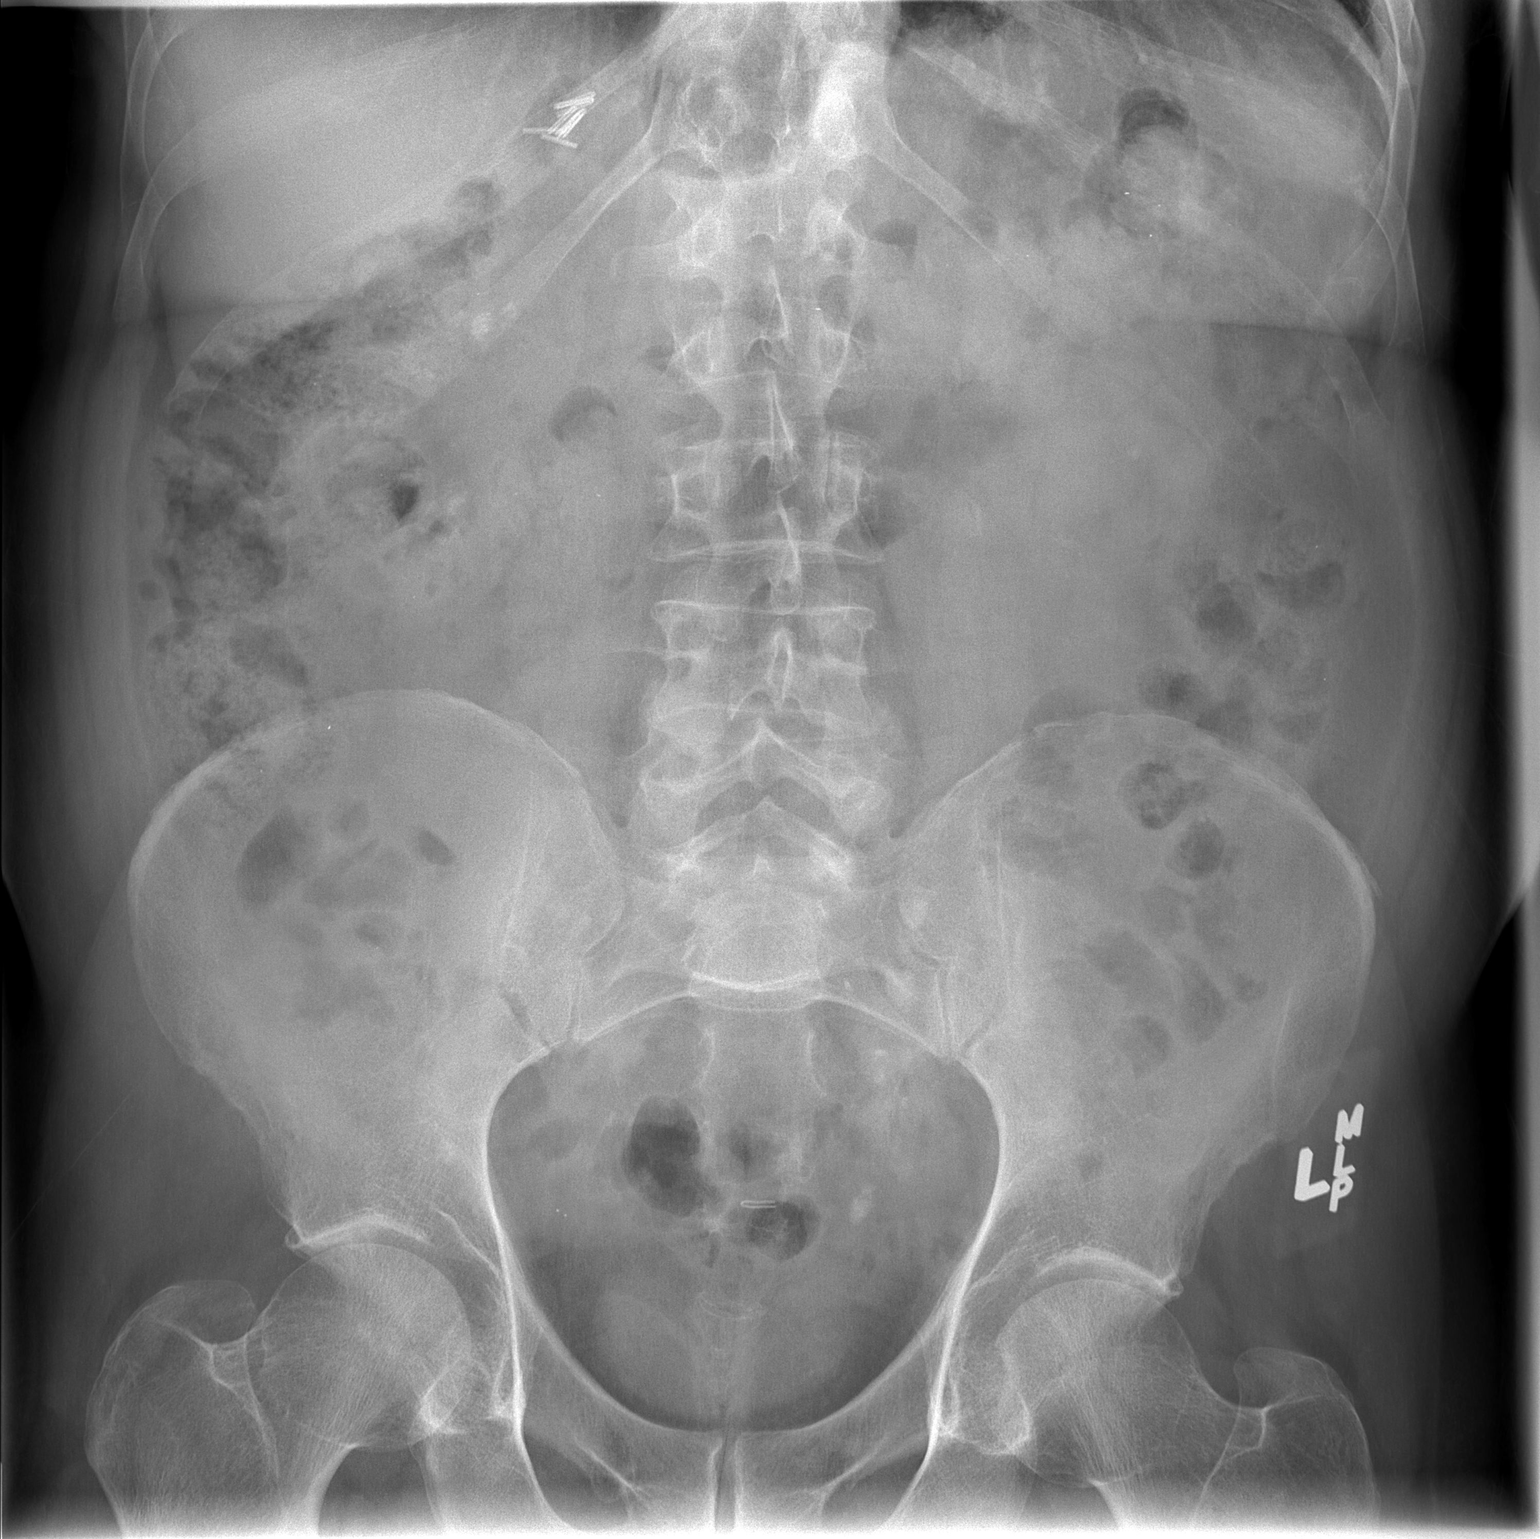

[1 of 1 positions shown; findings below may reference images not displayed]

FINDINGS: The bowel gas pattern is normal. Status post cholecystectomy. Right
nephrolithiasis is noted. Multiple large distal left ureteral
calculi are noted as described on prior CT scan. Small calculus is
seen projected over lower pole of left kidney.
IMPRESSION: Bilateral nephrolithiasis. Multiple large distal left ureteral
calculi are noted as described on prior CT scan.

## 2015-10-23 IMAGING — CR DG ABDOMEN 1V
1 series · 1 of 1 positions shown · non-contrast
Comparison: KUB November 06, 2014

CLINICAL DATA: Preprocedural KUB for left ureteral stone, mild
left-sided abdominal pain

EXAM:
ABDOMEN - 1 VIEW

[t abdomen supine]
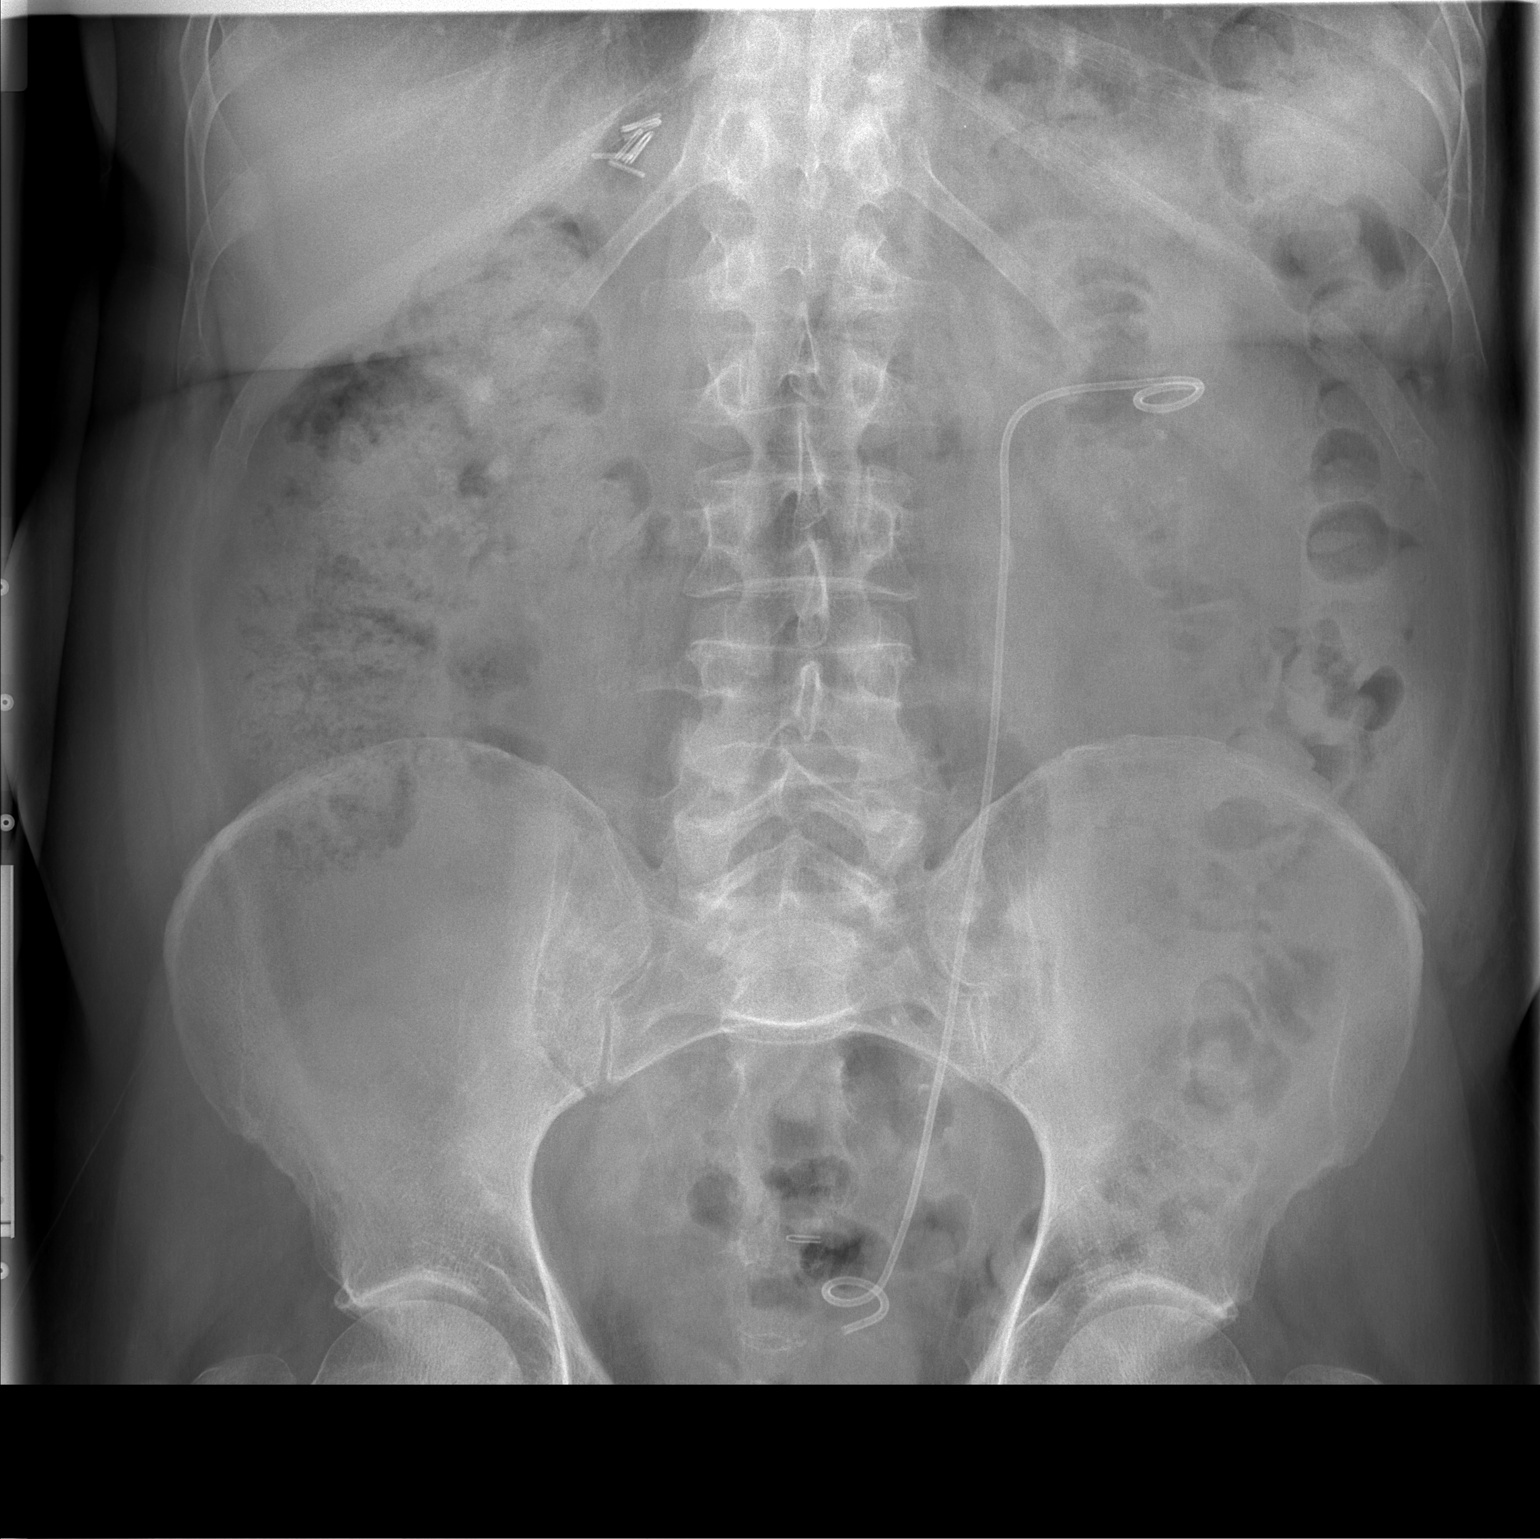

[1 of 1 positions shown; findings below may reference images not displayed]

FINDINGS: The left double pigtail stent is stable in position. There is a 2 mm
diameter stone adjacent to the proximal pigtail. There are smaller
tiny stones projecting over the lower pole of the left kidney. On
the right there are 2 stones measuring approximately 6 mm in
diameter that project over the midpole. Other smaller stones are
visible but less conspicuous over the lower pole of the right
kidney. The bowel gas pattern is unremarkable. The bony structures
are normal. There are surgical clips in the gallbladder fossa.
IMPRESSION: Stable appearance of the bilateral kidney stones and a left ureteral
stent.

## 2015-11-16 ENCOUNTER — Ambulatory Visit (HOSPITAL_COMMUNITY)
Admission: RE | Admit: 2015-11-16 | Discharge: 2015-11-16 | Disposition: A | Discharge status: Home / Self Care | Payer: Medicare Other | Source: Ambulatory Visit | Attending: Cardiology | Admitting: Cardiology

## 2015-11-16 DIAGNOSIS — I714 Abdominal aortic aneurysm, without rupture, unspecified: Secondary | ICD-10-CM

## 2015-11-16 DIAGNOSIS — I708 Atherosclerosis of other arteries: Secondary | ICD-10-CM | POA: Insufficient documentation

## 2015-11-16 DIAGNOSIS — K219 Gastro-esophageal reflux disease without esophagitis: Secondary | ICD-10-CM | POA: Diagnosis not present

## 2015-11-16 DIAGNOSIS — I700 Atherosclerosis of aorta: Secondary | ICD-10-CM | POA: Insufficient documentation

## 2015-11-16 DIAGNOSIS — I10 Essential (primary) hypertension: Secondary | ICD-10-CM | POA: Diagnosis not present

## 2015-11-18 ENCOUNTER — Telehealth: Payer: Self-pay | Admitting: Cardiovascular Disease

## 2015-11-18 NOTE — Telephone Encounter (Signed)
New message ° ° ° ° ° ° °Pt returning nurse call  °

## 2015-11-18 NOTE — Telephone Encounter (Signed)
Patient aware of results. Per Dr. Johnsie Cancel tiny aneurysm f/u duplex in 2 years. Patient verbalized understanding. Recall in for 2 year follow-up study.

## 2015-12-07 ENCOUNTER — Encounter (INDEPENDENT_AMBULATORY_CARE_PROVIDER_SITE_OTHER): Payer: Self-pay

## 2015-12-07 ENCOUNTER — Ambulatory Visit (INDEPENDENT_AMBULATORY_CARE_PROVIDER_SITE_OTHER): Payer: Medicare Other | Admitting: Cardiovascular Disease

## 2015-12-07 ENCOUNTER — Encounter: Payer: Self-pay | Admitting: Cardiovascular Disease

## 2015-12-07 VITALS — BP 135/76 | HR 51 | Ht 70.0 in | Wt 192.4 lb

## 2015-12-07 DIAGNOSIS — I10 Essential (primary) hypertension: Secondary | ICD-10-CM

## 2015-12-07 NOTE — Patient Instructions (Signed)

## 2015-12-07 NOTE — Progress Notes (Signed)
Patient ID: Timothy [name], male   DOB: [birth year], 65 y.o.   MRN: [id number] HPI:  65 y.o.  male seen by Dr Stanford Breed in 8/12 and 9/12 for evaluation of abdominal aortic aneurysm. He had an abdominal ultrasound in April 2012 that revealed a focal bulge in the distal abdominal aorta measuring 3 cm in greatest diameter. This is a probable early distal aortic aneurysm. Echo in August of 2012 showed normal LV function and no thoracic aneurysm. Since he was last seen he denies dyspnea, chest pain, palpitations or syncope. Last visit lisinopril stopped due to drowsyness and cozaar started Tolerating this better Has not taken BP at home   [place] 10/2015  2.7 cm reviewed   10/2017      Working doing Art conservation still   BP elevated  Discussed lifestyle mods  Drinks a beer daily Uses salt Uses Aleve daily  BP elevated last visit told to start taking his ARB in morning  He did not have home readings Microscopic hematuria to see urology Likely kidney stone passed  Had uretal stent removed by [facility] 12/15/14     ROS: Denies fever, malais, weight loss, blurry vision, decreased visual acuity, cough, sputum, SOB, hemoptysis, pleuritic pain, palpitaitons, heartburn, abdominal pain, melena, lower extremity edema, claudication, or rash.  All other systems reviewed and negative  BP 135/76   Pulse (!) 51   Ht 5\' 10"  (1.778 m)   Wt 192 lb 6.4 oz (87.3 kg)   BMI 27.61 kg/m   General: Affect appropriate Healthy:  appears stated age 65: normal Neck supple with no adenopathy JVP normal no bruits no thyromegaly Lungs clear with no wheezing and good diaphragmatic motion Heart:  S1/S2 no murmur, no rub, gallop or click PMI normal Abdomen: benighn, BS positve, no tenderness, no AAA no bruit.  No HSM or HJR Distal pulses intact with no bruits No edema Neuro non-focal Skin warm and dry No muscular weakness   Current Outpatient Prescriptions  Medication Sig Dispense Refill  . aspirin EC 81 MG tablet  Take 1 tablet (81 mg total) by mouth daily.    . fluticasone (FLONASE) 50 MCG/ACT nasal spray Place 1 spray into the nose 2 (two) times daily as needed for rhinitis. 16 g 5  . losartan (COZAAR) 100 MG tablet Take 100 mg by mouth every morning.  3  . Naproxen Sodium (ALEVE) 220 MG CAPS Take 440 mg by mouth daily.    . ranitidine (ZANTAC) 150 MG tablet Take 150 mg by mouth 2 (two) times daily.       No current facility-administered medications for this visit.     Allergies  Doxycycline; Dye fdc blue [brilliant blue fcf (fd&c blue #1)]; Penicillins; Septra [sulfamethoxazole-trimethoprim]; and Sulfa antibiotics  Electrocardiogram:  NSR rate 71 normal  2015  07/13/14  SR rate 61 LVH  Voltage  12/07/15 SR rate 52 voltage for LVH  Assessment and Plan HTN:  Well controlled.  Continue current medications and low sodium Dash type diet.   AAA:  2.7 cm although easy to palpate f/u duplex 2019 Urology:  Post stent removal on flomax f/u Dr Jeanella Anton

## 2015-12-20 DIAGNOSIS — N200 Calculus of kidney: Secondary | ICD-10-CM | POA: Diagnosis not present

## 2015-12-23 DIAGNOSIS — N200 Calculus of kidney: Secondary | ICD-10-CM | POA: Diagnosis not present

## 2016-07-12 ENCOUNTER — Other Ambulatory Visit (HOSPITAL_COMMUNITY): Payer: BLUE CROSS/BLUE SHIELD

## 2016-08-23 DIAGNOSIS — R358 Other polyuria: Secondary | ICD-10-CM | POA: Diagnosis not present

## 2016-08-23 DIAGNOSIS — I10 Essential (primary) hypertension: Secondary | ICD-10-CM | POA: Diagnosis not present

## 2016-08-23 DIAGNOSIS — Z125 Encounter for screening for malignant neoplasm of prostate: Secondary | ICD-10-CM | POA: Diagnosis not present

## 2016-08-23 DIAGNOSIS — R8299 Other abnormal findings in urine: Secondary | ICD-10-CM | POA: Diagnosis not present

## 2016-08-23 DIAGNOSIS — E7800 Pure hypercholesterolemia, unspecified: Secondary | ICD-10-CM | POA: Diagnosis not present

## 2016-08-30 DIAGNOSIS — Z23 Encounter for immunization: Secondary | ICD-10-CM | POA: Diagnosis not present

## 2016-08-30 DIAGNOSIS — R7309 Other abnormal glucose: Secondary | ICD-10-CM | POA: Diagnosis not present

## 2016-08-30 DIAGNOSIS — R808 Other proteinuria: Secondary | ICD-10-CM | POA: Diagnosis not present

## 2016-08-30 DIAGNOSIS — Z0000 Encounter for general adult medical examination without abnormal findings: Secondary | ICD-10-CM | POA: Diagnosis not present

## 2016-08-30 DIAGNOSIS — N200 Calculus of kidney: Secondary | ICD-10-CM | POA: Diagnosis not present

## 2016-08-30 DIAGNOSIS — E7800 Pure hypercholesterolemia, unspecified: Secondary | ICD-10-CM | POA: Diagnosis not present

## 2016-08-30 DIAGNOSIS — Z6827 Body mass index (BMI) 27.0-27.9, adult: Secondary | ICD-10-CM | POA: Diagnosis not present

## 2016-08-30 DIAGNOSIS — N182 Chronic kidney disease, stage 2 (mild): Secondary | ICD-10-CM | POA: Diagnosis not present

## 2016-08-30 DIAGNOSIS — I714 Abdominal aortic aneurysm, without rupture: Secondary | ICD-10-CM | POA: Diagnosis not present

## 2016-08-30 DIAGNOSIS — Z1389 Encounter for screening for other disorder: Secondary | ICD-10-CM | POA: Diagnosis not present

## 2016-08-30 DIAGNOSIS — D126 Benign neoplasm of colon, unspecified: Secondary | ICD-10-CM | POA: Diagnosis not present

## 2016-08-30 DIAGNOSIS — I129 Hypertensive chronic kidney disease with stage 1 through stage 4 chronic kidney disease, or unspecified chronic kidney disease: Secondary | ICD-10-CM | POA: Diagnosis not present

## 2016-09-04 DIAGNOSIS — Z1212 Encounter for screening for malignant neoplasm of rectum: Secondary | ICD-10-CM | POA: Diagnosis not present

## 2016-10-19 DIAGNOSIS — H524 Presbyopia: Secondary | ICD-10-CM | POA: Diagnosis not present

## 2016-10-19 DIAGNOSIS — H52203 Unspecified astigmatism, bilateral: Secondary | ICD-10-CM | POA: Diagnosis not present

## 2016-10-19 DIAGNOSIS — H2513 Age-related nuclear cataract, bilateral: Secondary | ICD-10-CM | POA: Diagnosis not present

## 2016-12-12 DIAGNOSIS — R3129 Other microscopic hematuria: Secondary | ICD-10-CM | POA: Diagnosis not present

## 2016-12-12 DIAGNOSIS — N200 Calculus of kidney: Secondary | ICD-10-CM | POA: Diagnosis not present

## 2016-12-12 NOTE — Progress Notes (Signed)
Patient ID: Timothy [name], male   DOB: 05-22-1950, 66 y.o.   MRN: [id number] HPI:  66 y.o.  male followed for HTN, mild dilatation of abdominal aorta and history of kidney stone with uretal stent. Has not had any further urologic issues. Last duplex reviewed and abdominal aorta only 2.7 x 2.4 cm.  11/16/15 Not due for f/u until July  2019 BP has been Rx with ARB   Has had hematuria again for 5 days seeing [facility] ? Stones     ROS: Denies fever, malais, weight loss, blurry vision, decreased visual acuity, cough, sputum, SOB, hemoptysis, pleuritic pain, palpitaitons, heartburn, abdominal pain, melena, lower extremity edema, claudication, or rash.  All other systems reviewed and negative  BP 122/78   Pulse 61   Ht 5\' 10"  (1.778 m)   Wt 191 lb 8 oz (86.9 kg)   SpO2 97%   BMI 27.48 kg/m   General: Affect appropriate Healthy:  appears stated age HEENT: normal Neck supple with no adenopathy JVP normal no bruits no thyromegaly Lungs clear with no wheezing and good diaphragmatic motion Heart:  S1/S2 no murmur, no rub, gallop or click PMI normal Abdomen: benighn, BS positve, no tenderness, no AAA no bruit.  No HSM or HJR Distal pulses intact with no bruits No edema Neuro non-focal Skin warm and dry No muscular weakness     Current Outpatient Prescriptions  Medication Sig Dispense Refill  . aspirin EC 81 MG tablet Take 1 tablet (81 mg total) by mouth daily.    Marland Kitchen losartan (COZAAR) 100 MG tablet Take 100 mg by mouth every morning.  3  . Naproxen Sodium (ALEVE) 220 MG CAPS Take 440 mg by mouth daily.    . ranitidine (ZANTAC) 150 MG tablet Take 150 mg by mouth 2 (two) times daily.       No current facility-administered medications for this visit.     Allergies  Doxycycline; Dye fdc blue [brilliant blue fcf (fd&c blue #1)]; Penicillins; Septra [sulfamethoxazole-trimethoprim]; and Sulfa antibiotics  Electrocardiogram:  NSR rate 71 normal  2015  07/13/14  SR rate 61 LVH  Voltage   12/07/15 SR rate 52 voltage for LVH  12/14/16  SR rate 61 normal   Assessment and Plan  HTN:  Well controlled.  Continue current medications and low sodium Dash type diet.   AAA:  2.7 cm although easy to palpate f/u duplex 2019 Urology:  Post stent removal on flomax f/u Dr Edwena Blow for recurring hematuria  F/U PRN    Jenkins Rouge

## 2016-12-14 ENCOUNTER — Ambulatory Visit (INDEPENDENT_AMBULATORY_CARE_PROVIDER_SITE_OTHER): Payer: Medicare Other | Admitting: Cardiovascular Disease

## 2016-12-14 ENCOUNTER — Encounter: Payer: Self-pay | Admitting: Cardiovascular Disease

## 2016-12-14 ENCOUNTER — Encounter (INDEPENDENT_AMBULATORY_CARE_PROVIDER_SITE_OTHER): Payer: Self-pay

## 2016-12-14 VITALS — BP 122/78 | HR 61 | Ht 70.0 in | Wt 191.5 lb

## 2016-12-14 DIAGNOSIS — I10 Essential (primary) hypertension: Secondary | ICD-10-CM | POA: Diagnosis not present

## 2016-12-14 NOTE — Patient Instructions (Signed)
Medication Instructions:  Your physician recommends that you continue on your current medications as directed. Please refer to the Current Medication list given to you today.   Labwork: NONE ORDERED   Testing/Procedures: NONE ORDERED   Follow-Up: AS NEEDED   Any Other Special Instructions Will Be Listed Below (If Applicable).     If you need a refill on your cardiac medications before your next appointment, please call your pharmacy.

## 2017-02-22 ENCOUNTER — Other Ambulatory Visit: Payer: Self-pay | Admitting: *Deleted

## 2017-02-22 DIAGNOSIS — I714 Abdominal aortic aneurysm, without rupture, unspecified: Secondary | ICD-10-CM

## 2017-02-27 DIAGNOSIS — I714 Abdominal aortic aneurysm, without rupture: Secondary | ICD-10-CM | POA: Diagnosis not present

## 2017-02-27 DIAGNOSIS — E7800 Pure hypercholesterolemia, unspecified: Secondary | ICD-10-CM | POA: Diagnosis not present

## 2017-02-27 DIAGNOSIS — N182 Chronic kidney disease, stage 2 (mild): Secondary | ICD-10-CM | POA: Diagnosis not present

## 2017-02-27 DIAGNOSIS — Z205 Contact with and (suspected) exposure to viral hepatitis: Secondary | ICD-10-CM | POA: Diagnosis not present

## 2017-02-27 DIAGNOSIS — Z6827 Body mass index (BMI) 27.0-27.9, adult: Secondary | ICD-10-CM | POA: Diagnosis not present

## 2017-02-27 DIAGNOSIS — I129 Hypertensive chronic kidney disease with stage 1 through stage 4 chronic kidney disease, or unspecified chronic kidney disease: Secondary | ICD-10-CM | POA: Diagnosis not present

## 2017-02-27 DIAGNOSIS — R808 Other proteinuria: Secondary | ICD-10-CM | POA: Diagnosis not present

## 2017-03-27 ENCOUNTER — Ambulatory Visit (HOSPITAL_COMMUNITY)
Admission: RE | Admit: 2017-03-27 | Discharge: 2017-03-27 | Disposition: A | Discharge status: Home / Self Care | Payer: Medicare Other | Source: Ambulatory Visit | Attending: Cardiology | Admitting: Cardiology

## 2017-03-27 DIAGNOSIS — I708 Atherosclerosis of other arteries: Secondary | ICD-10-CM | POA: Diagnosis not present

## 2017-03-27 DIAGNOSIS — I714 Abdominal aortic aneurysm, without rupture, unspecified: Secondary | ICD-10-CM

## 2017-03-27 DIAGNOSIS — I77811 Abdominal aortic ectasia: Secondary | ICD-10-CM | POA: Insufficient documentation

## 2017-08-28 DIAGNOSIS — I10 Essential (primary) hypertension: Secondary | ICD-10-CM | POA: Diagnosis not present

## 2017-08-28 DIAGNOSIS — R7309 Other abnormal glucose: Secondary | ICD-10-CM | POA: Diagnosis not present

## 2017-08-28 DIAGNOSIS — Z125 Encounter for screening for malignant neoplasm of prostate: Secondary | ICD-10-CM | POA: Diagnosis not present

## 2017-08-28 DIAGNOSIS — R82998 Other abnormal findings in urine: Secondary | ICD-10-CM | POA: Diagnosis not present

## 2017-08-28 DIAGNOSIS — E7800 Pure hypercholesterolemia, unspecified: Secondary | ICD-10-CM | POA: Diagnosis not present

## 2017-08-31 DIAGNOSIS — Z1212 Encounter for screening for malignant neoplasm of rectum: Secondary | ICD-10-CM | POA: Diagnosis not present

## 2017-09-04 DIAGNOSIS — R7309 Other abnormal glucose: Secondary | ICD-10-CM | POA: Diagnosis not present

## 2017-09-04 DIAGNOSIS — Z205 Contact with and (suspected) exposure to viral hepatitis: Secondary | ICD-10-CM | POA: Diagnosis not present

## 2017-09-04 DIAGNOSIS — Z0000 Encounter for general adult medical examination without abnormal findings: Secondary | ICD-10-CM | POA: Diagnosis not present

## 2017-09-04 DIAGNOSIS — E7800 Pure hypercholesterolemia, unspecified: Secondary | ICD-10-CM | POA: Diagnosis not present

## 2017-09-04 DIAGNOSIS — D126 Benign neoplasm of colon, unspecified: Secondary | ICD-10-CM | POA: Diagnosis not present

## 2017-09-04 DIAGNOSIS — Z1389 Encounter for screening for other disorder: Secondary | ICD-10-CM | POA: Diagnosis not present

## 2017-09-04 DIAGNOSIS — R808 Other proteinuria: Secondary | ICD-10-CM | POA: Diagnosis not present

## 2017-09-04 DIAGNOSIS — N200 Calculus of kidney: Secondary | ICD-10-CM | POA: Diagnosis not present

## 2017-09-04 DIAGNOSIS — Z6827 Body mass index (BMI) 27.0-27.9, adult: Secondary | ICD-10-CM | POA: Diagnosis not present

## 2017-09-04 DIAGNOSIS — I129 Hypertensive chronic kidney disease with stage 1 through stage 4 chronic kidney disease, or unspecified chronic kidney disease: Secondary | ICD-10-CM | POA: Diagnosis not present

## 2017-09-04 DIAGNOSIS — I714 Abdominal aortic aneurysm, without rupture: Secondary | ICD-10-CM | POA: Diagnosis not present

## 2017-09-04 DIAGNOSIS — N182 Chronic kidney disease, stage 2 (mild): Secondary | ICD-10-CM | POA: Diagnosis not present

## 2017-11-05 DIAGNOSIS — H2513 Age-related nuclear cataract, bilateral: Secondary | ICD-10-CM | POA: Diagnosis not present

## 2017-12-13 DIAGNOSIS — N200 Calculus of kidney: Secondary | ICD-10-CM | POA: Diagnosis not present

## 2018-01-01 DIAGNOSIS — M5136 Other intervertebral disc degeneration, lumbar region: Secondary | ICD-10-CM | POA: Diagnosis not present

## 2018-01-01 DIAGNOSIS — M4186 Other forms of scoliosis, lumbar region: Secondary | ICD-10-CM | POA: Diagnosis not present

## 2018-01-01 DIAGNOSIS — M545 Low back pain: Secondary | ICD-10-CM | POA: Diagnosis not present

## 2018-01-14 DIAGNOSIS — M545 Low back pain: Secondary | ICD-10-CM | POA: Diagnosis not present

## 2018-01-18 DIAGNOSIS — M545 Low back pain: Secondary | ICD-10-CM | POA: Diagnosis not present

## 2018-01-22 DIAGNOSIS — M545 Low back pain: Secondary | ICD-10-CM | POA: Diagnosis not present

## 2018-01-24 DIAGNOSIS — M545 Low back pain: Secondary | ICD-10-CM | POA: Diagnosis not present

## 2018-01-29 DIAGNOSIS — M545 Low back pain: Secondary | ICD-10-CM | POA: Diagnosis not present

## 2018-01-31 DIAGNOSIS — M545 Low back pain: Secondary | ICD-10-CM | POA: Diagnosis not present

## 2018-02-04 DIAGNOSIS — M545 Low back pain: Secondary | ICD-10-CM | POA: Diagnosis not present

## 2018-02-07 DIAGNOSIS — M545 Low back pain: Secondary | ICD-10-CM | POA: Diagnosis not present

## 2018-02-12 DIAGNOSIS — M545 Low back pain: Secondary | ICD-10-CM | POA: Diagnosis not present

## 2018-10-01 DIAGNOSIS — N400 Enlarged prostate without lower urinary tract symptoms: Secondary | ICD-10-CM | POA: Diagnosis not present

## 2018-10-01 DIAGNOSIS — N270 Small kidney, unilateral: Secondary | ICD-10-CM | POA: Diagnosis not present

## 2018-10-01 DIAGNOSIS — N200 Calculus of kidney: Secondary | ICD-10-CM | POA: Diagnosis not present

## 2018-10-01 DIAGNOSIS — R310 Gross hematuria: Secondary | ICD-10-CM | POA: Diagnosis not present

## 2018-10-01 DIAGNOSIS — K5730 Diverticulosis of large intestine without perforation or abscess without bleeding: Secondary | ICD-10-CM | POA: Diagnosis not present

## 2018-10-02 DIAGNOSIS — R310 Gross hematuria: Secondary | ICD-10-CM | POA: Diagnosis not present

## 2018-11-28 DIAGNOSIS — H524 Presbyopia: Secondary | ICD-10-CM | POA: Diagnosis not present

## 2018-11-28 DIAGNOSIS — H2513 Age-related nuclear cataract, bilateral: Secondary | ICD-10-CM | POA: Diagnosis not present

## 2018-11-28 DIAGNOSIS — H52203 Unspecified astigmatism, bilateral: Secondary | ICD-10-CM | POA: Diagnosis not present

## 2018-12-19 DIAGNOSIS — Z0120 Encounter for dental examination and cleaning without abnormal findings: Secondary | ICD-10-CM | POA: Diagnosis not present

## 2019-03-01 DIAGNOSIS — E782 Mixed hyperlipidemia: Secondary | ICD-10-CM | POA: Diagnosis not present

## 2019-03-01 DIAGNOSIS — Z6826 Body mass index (BMI) 26.0-26.9, adult: Secondary | ICD-10-CM | POA: Diagnosis not present

## 2019-03-01 DIAGNOSIS — Z1212 Encounter for screening for malignant neoplasm of rectum: Secondary | ICD-10-CM | POA: Diagnosis not present

## 2019-03-01 DIAGNOSIS — Z0001 Encounter for general adult medical examination with abnormal findings: Secondary | ICD-10-CM | POA: Diagnosis not present

## 2019-11-26 DIAGNOSIS — D225 Melanocytic nevi of trunk: Secondary | ICD-10-CM | POA: Diagnosis not present

## 2019-11-26 DIAGNOSIS — X32XXXA Exposure to sunlight, initial encounter: Secondary | ICD-10-CM | POA: Diagnosis not present

## 2019-11-26 DIAGNOSIS — L570 Actinic keratosis: Secondary | ICD-10-CM | POA: Diagnosis not present

## 2019-11-26 DIAGNOSIS — D485 Neoplasm of uncertain behavior of skin: Secondary | ICD-10-CM | POA: Diagnosis not present

## 2020-03-03 DIAGNOSIS — E559 Vitamin D deficiency, unspecified: Secondary | ICD-10-CM | POA: Diagnosis not present

## 2020-03-03 DIAGNOSIS — Z1212 Encounter for screening for malignant neoplasm of rectum: Secondary | ICD-10-CM | POA: Diagnosis not present

## 2020-03-03 DIAGNOSIS — Z0001 Encounter for general adult medical examination with abnormal findings: Secondary | ICD-10-CM | POA: Diagnosis not present

## 2020-03-03 DIAGNOSIS — Z6827 Body mass index (BMI) 27.0-27.9, adult: Secondary | ICD-10-CM | POA: Diagnosis not present

## 2021-01-20 DIAGNOSIS — H2513 Age-related nuclear cataract, bilateral: Secondary | ICD-10-CM | POA: Diagnosis not present

## 2021-01-20 DIAGNOSIS — H524 Presbyopia: Secondary | ICD-10-CM | POA: Diagnosis not present

## 2021-01-20 DIAGNOSIS — H52203 Unspecified astigmatism, bilateral: Secondary | ICD-10-CM | POA: Diagnosis not present

## 2021-02-28 DIAGNOSIS — Z0100 Encounter for examination of eyes and vision without abnormal findings: Secondary | ICD-10-CM | POA: Diagnosis not present

## 2021-03-18 DIAGNOSIS — E7849 Other hyperlipidemia: Secondary | ICD-10-CM | POA: Diagnosis not present

## 2021-03-18 DIAGNOSIS — E559 Vitamin D deficiency, unspecified: Secondary | ICD-10-CM | POA: Diagnosis not present

## 2021-03-18 DIAGNOSIS — E782 Mixed hyperlipidemia: Secondary | ICD-10-CM | POA: Diagnosis not present

## 2021-03-18 DIAGNOSIS — Z0001 Encounter for general adult medical examination with abnormal findings: Secondary | ICD-10-CM | POA: Diagnosis not present

## 2021-03-18 DIAGNOSIS — Z1212 Encounter for screening for malignant neoplasm of rectum: Secondary | ICD-10-CM | POA: Diagnosis not present

## 2021-03-18 DIAGNOSIS — Z6827 Body mass index (BMI) 27.0-27.9, adult: Secondary | ICD-10-CM | POA: Diagnosis not present

## 2021-03-18 DIAGNOSIS — R300 Dysuria: Secondary | ICD-10-CM | POA: Diagnosis not present

## 2021-03-18 DIAGNOSIS — Z125 Encounter for screening for malignant neoplasm of prostate: Secondary | ICD-10-CM | POA: Diagnosis not present

## 2021-04-06 DIAGNOSIS — E7849 Other hyperlipidemia: Secondary | ICD-10-CM | POA: Diagnosis not present

## 2021-04-06 DIAGNOSIS — Z0001 Encounter for general adult medical examination with abnormal findings: Secondary | ICD-10-CM | POA: Diagnosis not present

## 2021-05-23 DIAGNOSIS — H5310 Unspecified subjective visual disturbances: Secondary | ICD-10-CM | POA: Diagnosis not present

## 2021-05-23 DIAGNOSIS — H2513 Age-related nuclear cataract, bilateral: Secondary | ICD-10-CM | POA: Diagnosis not present

## 2021-05-25 DIAGNOSIS — R69 Illness, unspecified: Secondary | ICD-10-CM | POA: Diagnosis not present

## 2021-06-08 NOTE — Progress Notes (Signed)
Cardiology Office Note:    Date:  06/09/2021   ID:  Timothy [name], DOB [birth year], MRN [id number]  PCP:  Haywood Pao, MD   [facility] Providers Cardiologist:  Werner Lean, MD     Referring MD: Haywood Pao, MD   CC: Worsening hypertension Consulted for the evaluation of AAA at the behest of Tisovec, Fransico Him, MD  History of Present Illness:    Timothy [name] is a 71 y.o. male with a hx of Mild AAA (3.0 at last evaluation), LVH with HTN, HLD, who presents to re-establish care.  Patient notes that he is feeling poorly when his BP was elevated.  Three weeks prior had elevated blood pressure with new vision changes, after seeing ophthalmology.  Since then has new left shoulder pain when pressures get high.  No chest pain.  Pain does radiate to the back.  Headache and vision issues.  No syncope or near syncope. Patient exertion notable for going on a treadmill 20-30 minutes a day and feels no symptoms.  Also had new shortness of breath when his blood pressure is high.  No PND or orthopnea.  No weight gain, leg swelling , or abdominal swelling.  No syncope or near syncope . Notes  no palpitations or funny heart beats.    His symptoms resolved on days his BP is controlled.   Has rare kidney stone related abdominal pain (~ every other month)  Has taken no naproxen this year.  STOPBANG 5.  Ambulatory BP 150/100.  Log reviewed   Past Medical History:  Diagnosis Date   Abdominal aortic ectasia ([place]) MONITORED BY CARDIOLOGIST-  DR Johnsie Cancel   3.0 cm      PER DUPLEX 10-15-2013   DDD (degenerative disc disease), cervical    Diverticulosis of colon    GERD (gastroesophageal reflux disease)    History of adenomatous polyp of colon    History of kidney stones    History of prostatitis    RECURRENT   Hypertension    IC (interstitial cystitis)    Left ureteral stone    Nephrolithiasis    Seasonal allergic rhinitis    Wears glasses     Past Surgical History:   Procedure Laterality Date   COLONOSCOPY  last one 11-25-2013   CYSTOSCOPY W/ RETROGRADES Left 12/04/2014   Procedure:  RETROGRADE PYELOGRAM;  Surgeon: Kathie Rhodes, MD;  Location: [facility];  Service: Urology;  Laterality: Left;   CYSTOSCOPY WITH HOLMIUM LASER LITHOTRIPSY Left 12/04/2014   Procedure: CYSTOSCOPY WITH HOLMIUM LASER LITHOTRIPSY;  Surgeon: Kathie Rhodes, MD;  Location: [facility];  Service: Urology;  Laterality: Left;   CYSTOSCOPY WITH RETROGRADE PYELOGRAM, URETEROSCOPY AND STENT PLACEMENT Left 10/30/2014   Procedure: CYSTO, LEFT RETROGRADE PYELOGRAM, URETEROSCOPY AND STENT PLACEMENT;  Surgeon: Kathie Rhodes, MD;  Location: [facility];  Service: Urology;  Laterality: Left;   CYSTOSCOPY WITH URETEROSCOPY AND STENT PLACEMENT Left 12/04/2014   Procedure: CYSTOSCOPY WITH URETEROSCOPY WITH STENT REPLACEMENT;  Surgeon: Kathie Rhodes, MD;  Location: [facility];  Service: Urology;  Laterality: Left;   HOLMIUM LASER APPLICATION Left 09/30/9507   Procedure: LEFT HOLMIUM LASER LITHO;  Surgeon: Kathie Rhodes, MD;  Location: [facility];  Service: Urology;  Laterality: Left;   KNEE ARTHROSCOPY Right 06-14-2002   LAPAROSCOPIC CHOLECYSTECTOMY  01-16-2000   PILONIDAL CYST EXCISION  1995   STONE EXTRACTION WITH BASKET Left 12/04/2014   Procedure: STONE EXTRACTION WITH BASKET;  Surgeon: Elta Guadeloupe  Karsten Ro, MD;  Location: [facility];  Service: Urology;  Laterality: Left;   TONSILLECTOMY  as child   TRANSTHORACIC ECHOCARDIOGRAM  12-20-2010   normal echo, ef 65%    Current Medications: Current Meds  Medication Sig   aspirin EC 81 MG tablet Take 1 tablet (81 mg total) by mouth daily.   cyclobenzaprine (FLEXERIL) 10 MG tablet Take 10 mg by mouth at bedtime.   famotidine (PEPCID) 40 MG tablet Take 40 mg by mouth daily.   hydrochlorothiazide (HYDRODIURIL) 25 MG tablet Take 1 tablet (25 mg total) by mouth daily.   indapamide  (LOZOL) 2.5 MG tablet daily at 6 (six) AM.   losartan (COZAAR) 100 MG tablet Take 100 mg by mouth every morning.   Naproxen Sodium 220 MG CAPS Take 440 mg by mouth daily.   [DISCONTINUED] ranitidine (ZANTAC) 150 MG tablet Take 150 mg by mouth 2 (two) times daily.       Allergies:   Doxycycline, Dye fdc blue [brilliant blue fcf (fd&c blue #1)], Penicillins, Septra [sulfamethoxazole-trimethoprim], and Sulfa antibiotics   Social History   Socioeconomic History   Marital status: Married    Spouse name: Not on file   Number of children: Not on file   Years of education: Not on file   Highest education level: Not on file  Occupational History    Employer: SELF-EMPLOYED    Comment: Art conservation  Tobacco Use   Smoking status: Former    Packs/day: 1.00    Years: 32.00    Pack years: 32.00    Types: Cigarettes    Quit date: 05/02/2011    Years since quitting: 10.1   Smokeless tobacco: Never  Substance and Sexual Activity   Alcohol use: Yes    Alcohol/week: 6.0 standard drinks    Types: 6 Cans of beer per week   Drug use: No   Sexual activity: Not on file  Other Topics Concern   Not on file  Social History Narrative   Not on file   Social Determinants of Health   Financial Resource Strain: Not on file  Food Insecurity: Not on file  Transportation Needs: Not on file  Physical Activity: Not on file  Stress: Not on file  Social Connections: Not on file     Family History: The patient's family history includes Aneurysm (age of onset: 69) in his father; Cancer in his paternal uncle; Colitis in his mother; Heart failure in his mother. There is no history of Diabetes or Stroke.  ROS:   Please see the history of present illness.     All other systems reviewed and are negative.  EKGs/Labs/Other Studies Reviewed:    The following studies were reviewed today:  EKG:  EKG is  ordered today.  The ekg ordered today demonstrates  06/09/21: Sinus tachycardia  Recent Labs: No  results found for requested labs within last 8760 hours.  Recent Lipid Panel    Component Value Date/Time   CHOL 240 (H) 07/16/2014 0914   TRIG 109 07/16/2014 0914   HDL 59 07/16/2014 0914   CHOLHDL 3.1 Ratio 07/24/2008 2030   VLDL 13 07/24/2008 2030   LDLCALC 159 (H) 07/16/2014 0914   LDLDIRECT 140.3 06/06/2006 1039        Physical Exam:    VS:  BP (!) 150/110    Pulse (!) 110    Ht 5\' 10"  (1.778 m)    Wt 84.9 kg    SpO2 98%    BMI 26.86 kg/m  Wt Readings from Last 3 Encounters:  06/09/21 84.9 kg  12/14/16 86.9 kg  12/07/15 87.3 kg    Gen: No distress  Neck: No JVD Ears: Bilateral Pilar Plate Sign Cardiac: No Rubs or Gallops, no Murmur, RRR +2 radial pulses Respiratory: Clear to auscultation bilaterally, normal effort, normal  respiratory rate GI: Soft, nontender, non-distended  MS: No  edema;  moves all extremities Integument: Skin feels warm Neuro:  At time of evaluation, alert and oriented to person/place/time/situation  Psych: Normal affect, patient feels unwell   ASSESSMENT:    1. Chest pain of uncertain etiology   2. Abdominal aortic aneurysm (AAA) without rupture, unspecified part   3. Daytime somnolence    PLAN:    HTN Chest pain equivalent (shoulder pain that radiates to the back) HLD Daytime Somnolence - adding HCTZ 25 mg PO daily to his losartan 100 mg PO daily - BMP in 7-10 days and BP follow up - STOPBANG 5 HST  - exercise NM Stress- will also assess hypertensive response - we have deferred renal arterial duplex at this time  AAA 3.0 - will get Duplex follow up  3-4 months with me or APP   Medication Adjustments/Labs and Tests Ordered: Current medicines are reviewed at length with the patient today.  Concerns regarding medicines are outlined above.  Orders Placed This Encounter  Procedures   Basic metabolic panel   Cardiac Stress Test: Informed Consent Details: Physician/Practitioner Attestation; Transcribe to consent form and obtain patient  signature   MYOCARDIAL PERFUSION IMAGING   EKG 12-Lead   Itamar Sleep Study   VAS [place] AAA DUPLEX   Meds ordered this encounter  Medications   hydrochlorothiazide (HYDRODIURIL) 25 MG tablet    Sig: Take 1 tablet (25 mg total) by mouth daily.    Dispense:  90 tablet    Refill:  3    Patient Instructions  Medication Instructions:  Your physician has recommended you make the following change in your medication:  START: hydrochlorothiazide (HCTZ) 25 mg by mouth daily  *If you need a refill on your cardiac medications before your next appointment, please call your pharmacy*   Lab Work: IN 7-10 DAYS: BMP  If you have labs (blood work) drawn today and your tests are completely normal, you will receive your results only by: [place] (if you have MyChart) OR A paper copy in the mail If you have any lab test that is abnormal or we need to change your treatment, we will call you to review the results.   Testing/Procedures: Your physician has requested that you have an abdominal aorta duplex. During this test, an ultrasound is used to evaluate the aorta. Allow 30 minutes for this exam. Do not eat after midnight the day before and avoid carbonated beverages   Your physician has requested that you have en exercise stress myoview. For further information please visit [email or web address]. Please follow instruction sheet, as given.   Your physician has recommended that you have a home sleep study. This test records several body functions during sleep, including: brain activity, eye movement, oxygen and carbon dioxide blood levels, heart rate and rhythm, breathing rate and rhythm, the flow of air through your mouth and nose, snoring, body muscle movements, and chest and belly movement.    Follow-Up: At [facility], you and your health needs are our priority.  As part of our continuing mission to provide you with exceptional heart care, we have created designated Provider Care Teams.   These Care Teams include  your primary Cardiologist (physician) and Advanced Practice Providers (APPs -  Physician Assistants and Nurse Practitioners) who all work together to provide you with the care you need, when you need it.  We recommend signing up for the patient portal called "MyChart".  Sign up information is provided on this After Visit Summary.  MyChart is used to connect with patients for Virtual Visits (Telemedicine).  Patients are able to view lab/test results, encounter notes, upcoming appointments, etc.  Non-urgent messages can be sent to your provider as well.   To learn more about what you can do with MyChart, go to [email or web address].    Your next appointment:   3 - 4 month(s)  The format for your next appointment:   In Person  Provider:   Werner Lean, MD    Other Instructions Please monitor your Blood Pressure    Signed, Werner Lean, MD  06/09/2021 9:16 AM    [place]

## 2021-06-09 ENCOUNTER — Telehealth: Payer: Self-pay | Admitting: *Deleted

## 2021-06-09 ENCOUNTER — Ambulatory Visit: Payer: Medicare HMO | Admitting: Internal Medicine

## 2021-06-09 ENCOUNTER — Encounter: Payer: Self-pay | Admitting: Internal Medicine

## 2021-06-09 ENCOUNTER — Other Ambulatory Visit: Payer: Self-pay

## 2021-06-09 VITALS — BP 150/110 | HR 110 | Ht 70.0 in | Wt 187.2 lb

## 2021-06-09 DIAGNOSIS — R072 Precordial pain: Secondary | ICD-10-CM | POA: Insufficient documentation

## 2021-06-09 DIAGNOSIS — I714 Abdominal aortic aneurysm, without rupture, unspecified: Secondary | ICD-10-CM | POA: Diagnosis not present

## 2021-06-09 DIAGNOSIS — R400 Somnolence: Secondary | ICD-10-CM | POA: Insufficient documentation

## 2021-06-09 DIAGNOSIS — R079 Chest pain, unspecified: Secondary | ICD-10-CM | POA: Diagnosis not present

## 2021-06-09 MED ORDER — HYDROCHLOROTHIAZIDE 25 MG PO TABS: 25.0000 mg | ORAL_TABLET | Freq: Every day | ORAL | 3 refills | Status: DC

## 2021-06-09 NOTE — Patient Instructions (Signed)
Medication Instructions:  Your physician has recommended you make the following change in your medication:  START: hydrochlorothiazide (HCTZ) 25 mg by mouth daily  *If you need a refill on your cardiac medications before your next appointment, please call your pharmacy*   Lab Work: IN 7-10 DAYS: BMP  If you have labs (blood work) drawn today and your tests are completely normal, you will receive your results only by: [place] (if you have MyChart) OR A paper copy in the mail If you have any lab test that is abnormal or we need to change your treatment, we will call you to review the results.   Testing/Procedures: Your physician has requested that you have an abdominal aorta duplex. During this test, an ultrasound is used to evaluate the aorta. Allow 30 minutes for this exam. Do not eat after midnight the day before and avoid carbonated beverages   Your physician has requested that you have en exercise stress myoview. For further information please visit [email or web address]. Please follow instruction sheet, as given.   Your physician has recommended that you have a home sleep study. This test records several body functions during sleep, including: brain activity, eye movement, oxygen and carbon dioxide blood levels, heart rate and rhythm, breathing rate and rhythm, the flow of air through your mouth and nose, snoring, body muscle movements, and chest and belly movement.    Follow-Up: At [facility], you and your health needs are our priority.  As part of our continuing mission to provide you with exceptional heart care, we have created designated Provider Care Teams.  These Care Teams include your primary Cardiologist (physician) and Advanced Practice Providers (APPs -  Physician Assistants and Nurse Practitioners) who all work together to provide you with the care you need, when you need it.  We recommend signing up for the patient portal called "MyChart".  Sign up information is  provided on this After Visit Summary.  MyChart is used to connect with patients for Virtual Visits (Telemedicine).  Patients are able to view lab/test results, encounter notes, upcoming appointments, etc.  Non-urgent messages can be sent to your provider as well.   To learn more about what you can do with MyChart, go to [email or web address].    Your next appointment:   3 - 4 month(s)  The format for your next appointment:   In Person  Provider:   Werner Lean, MD    Other Instructions Please monitor your Blood Pressure

## 2021-06-09 NOTE — Telephone Encounter (Signed)
Called and made the patient aware that he may proceed with the [facility] Sleep Study. PIN # [id number] provided to the patient. Patient made aware that he will be contacted after the test has been read with the results and any recommendations. Patient verbalized understanding and thanked me for the call.   Pt will do sleep study over the next few days.

## 2021-06-09 NOTE — Telephone Encounter (Signed)
PER AETNA MEDICARE NO PA REQUIRED FOR HST.

## 2021-06-09 NOTE — Telephone Encounter (Signed)
Pt was seen in the office today with Dr. Gasper Sells who ordered an Itamar sleep study. I have helped the pt set up the application on his phone today. I have reviewed the instructional tutorial with the pt, with verbal understanding. Pt aware to not open the device until we have called with the PIN # once we have authorization from the insurance company. Pt thanked me for the help today.

## 2021-06-10 ENCOUNTER — Telehealth: Payer: Self-pay | Admitting: Internal Medicine

## 2021-06-10 MED ORDER — AMLODIPINE BESYLATE 2.5 MG PO TABS: 2.5000 mg | ORAL_TABLET | Freq: Every day | ORAL | 3 refills | Status: DC

## 2021-06-10 NOTE — Telephone Encounter (Signed)
Called called in reporting that he started getting itching hives/ swelling a few hours after taking HCTZ.  Reports his BP has been 140-160/96-110. Added HCTZ to his allergy list. Will start amlodipine 2.5mg  daily. He will see [place] in HTN clinic on 2/15 (this was our only apt prior to 3/7).

## 2021-06-10 NOTE — Telephone Encounter (Signed)
Pt c/o medication issue:  1. Name of Medication: hydrochlorothiazide (HYDRODIURIL) 25 MG tablet  2. How are you currently taking this medication (dosage and times per day)? Take 1 tablet (25 mg total) by mouth daily  3. Are you having a reaction (difficulty breathing--STAT)? Yes   4. What is your medication issue? Patient is having a reaction to the medication with itching and swelling in eyes .

## 2021-06-14 ENCOUNTER — Telehealth (HOSPITAL_COMMUNITY): Payer: Self-pay

## 2021-06-14 NOTE — Telephone Encounter (Signed)
Detailed instructions left on the patient's answering machine. Asked to call back with any questions. S.Quantasia Stegner EMTP 

## 2021-06-15 ENCOUNTER — Other Ambulatory Visit: Payer: Self-pay

## 2021-06-15 ENCOUNTER — Ambulatory Visit: Payer: Medicare HMO | Admitting: Pharmacist

## 2021-06-15 VITALS — BP 150/102 | HR 85

## 2021-06-15 DIAGNOSIS — I10 Essential (primary) hypertension: Secondary | ICD-10-CM | POA: Diagnosis not present

## 2021-06-15 MED ORDER — AMLODIPINE BESYLATE 5 MG PO TABS: 5.0000 mg | ORAL_TABLET | Freq: Every day | ORAL | 5 refills | Status: DC

## 2021-06-15 NOTE — Patient Instructions (Addendum)
It was nice to see you today!  Your blood pressure goal is  < 130/31mmHg  Increase your amlodipine to 5mg  daily. You can double up and take 2 of your 2.5mg  tablets each day. When you are due to pick up your refill, it will be for a higher 5mg  dose to take 1 tablet daily  Continue taking your other medications and monitor your blood pressure at home  I'll call in 3 weeks to see how your home readings are looking  Limit daily sodium to 000mg 

## 2021-06-15 NOTE — Progress Notes (Signed)
Patient ID: Timothy [name]                 DOB: 02/12/1951                      MRN: [id number]     HPI: Timothy [name] is a 71 y.o. male referred by Dr. Gasper Sells to HTN clinic. PMH is significant for HTN accompanied by vision changes, headache, SOB and shoulder pain, GERD, kidney stones, and prostatitis. Last seen on 06/09/21 where BP was elevated at 150/110 and HR was 110. He was started on HCTZ 25mg  daily. He called clinic 4 days later reporting itching and swelling in his eyes along with hives. He does have a sulfa allergy. HCTZ was stopped and he was started on amlodipine 2.5mg  daily and scheduled with PharmD for follow up.  Pt presents today in good spirits. Reports tolerating amlodipine better. Took dandelion root which helped with his allergic reaction to HCTZ. Sx improved off of therapy. Denies dizziness and LE edema. Occasionally has ringing in his ears or tightness across his shoulders when his BP is high but hasn't had recurrence of sx mentioned above with high BP readings. Recently retired. Brings in home wrist BP cuff today along with log of home readings. Takes BP meds in the AM and checks BP around 4pm. Readings 148/98, 137/93, 130/83, 123/78, 130/80, 118/76 - better over past few days. Reports some white coat HTN. Denies NSAID use. Watches his diet and stays active.  Home wrist cuff reading: 153/99, 143/102 Clinic reading: 150/102  Current HTN meds: losartan 100mg  daily (8AM), amlodipine 2.5mg  daily (8AM) Previously tried: HCTZ 25mg  daily - itching and swelling of his eyes BP goal: <130/52mmHg  Family History: Mother with CHF and colitis, father with aneurysm.  Social History: Former smoker 1 PPD for 32 years, quit in 2013. Drinks 6 cans of bee per week. Denies drug use.  Diet: Doesn't add salt to food. 1/2 cup coffee in AM, hard boiled egg, muffin in the AM. Lunch - apple, pineapple, cottage cheese. Eating more avocados and spinach. Last night - salad with chicken and  veggies, tonight steak and veggies.  Exercise: Treadmill 30 minutes a day or walks outside  Home BP readings: 146/93, 149/107, 146/106, 156/103, 141/96, 148/98, 137/93, 130/83, 123/78, 130/80, 118/76 - better over the past few days  Wt Readings from Last 3 Encounters:  06/09/21 187 lb 3.2 oz (84.9 kg)  12/14/16 191 lb 8 oz (86.9 kg)  12/07/15 192 lb 6.4 oz (87.3 kg)   BP Readings from Last 3 Encounters:  06/09/21 (!) 150/110  12/14/16 122/78  12/07/15 135/76   Pulse Readings from Last 3 Encounters:  06/09/21 (!) 110  12/14/16 61  12/07/15 (!) 51    Renal function: CrCl cannot be calculated (Patient's most recent lab result is older than the maximum 21 days allowed.).  Past Medical History:  Diagnosis Date   Abdominal aortic ectasia ([facility]) MONITORED BY CARDIOLOGIST-  DR Johnsie Cancel   3.0 cm      PER DUPLEX 10-15-2013   DDD (degenerative disc disease), cervical    Diverticulosis of colon    GERD (gastroesophageal reflux disease)    History of adenomatous polyp of colon    History of kidney stones    History of prostatitis    RECURRENT   Hypertension    IC (interstitial cystitis)    Left ureteral stone    Nephrolithiasis    Seasonal allergic rhinitis  Wears glasses     Current Outpatient Medications on File Prior to Visit  Medication Sig Dispense Refill   amLODipine (NORVASC) 2.5 MG tablet Take 1 tablet (2.5 mg total) by mouth daily. 90 tablet 3   aspirin EC 81 MG tablet Take 1 tablet (81 mg total) by mouth daily.     cyclobenzaprine (FLEXERIL) 10 MG tablet Take 10 mg by mouth at bedtime.     famotidine (PEPCID) 40 MG tablet Take 40 mg by mouth daily.     losartan (COZAAR) 100 MG tablet Take 100 mg by mouth every morning.  3   Naproxen Sodium 220 MG CAPS Take 440 mg by mouth daily.     No current facility-administered medications on file prior to visit.    Allergies  Allergen Reactions   Doxycycline Hives   Dye Fdc Blue [Brilliant Blue Fcf (Fd&C Blue #1)] Hives     Contrast dye per pt   Penicillins Hives   Hydrochlorothiazide Hives   Septra [Sulfamethoxazole-Trimethoprim] Rash   Sulfa Antibiotics Rash     Assessment/Plan:  1. Hypertension - BP above goal <130/89mmHg in clinic. Pt reports white coat HTN, home wrist cuff measuring within ~24mmgHg of manual reading. Home BP readings have improved over the past few days but previously were consistently above goal. Will increase amlodipine to 5mg  daily and continue losartan 100mg  daily. Pt will continue to monitor BP at home. I'll call him in 3 weeks to follow up with home readings. Can continue with amlodipine dose adjustment if needed.  Hasel Janish E. Estrellita Lasky, PharmD, BCACP, [place] [address], [place], [place] [address] Phone: [phone]; Fax: [phone] 06/15/2021 2:02 PM

## 2021-06-16 ENCOUNTER — Other Ambulatory Visit: Payer: Medicare HMO

## 2021-06-16 ENCOUNTER — Ambulatory Visit (HOSPITAL_COMMUNITY): Payer: Medicare HMO | Attending: Cardiovascular Disease

## 2021-06-16 DIAGNOSIS — I714 Abdominal aortic aneurysm, without rupture, unspecified: Secondary | ICD-10-CM

## 2021-06-16 DIAGNOSIS — R079 Chest pain, unspecified: Secondary | ICD-10-CM | POA: Diagnosis not present

## 2021-06-16 LAB — MYOCARDIAL PERFUSION IMAGING
Angina Index: 0
Estimated workload: 8.3
Exercise duration (min): 7 min
Exercise duration (sec): 30 s
LV dias vol: 79 mL (ref 62–150)
LV sys vol: 27 mL
MPHR: 149 {beats}/min
Nuc Stress EF: 65 %
Peak HR: 133 {beats}/min
Percent HR: 89 %
Rest HR: 75 {beats}/min
Rest Nuclear Isotope Dose: 9.9 mCi
SDS: 0
SRS: 0
SSS: 0
Stress Nuclear Isotope Dose: 30.5 mCi
TID: 0.94

## 2021-06-16 MED ORDER — TECHNETIUM TC 99M TETROFOSMIN IV KIT
30.5000 | PACK | Freq: Once | INTRAVENOUS | Status: AC | PRN
  Administered 2021-06-16: 30.5 via INTRAVENOUS
  Filled 2021-06-16: qty 31

## 2021-06-16 MED ORDER — TECHNETIUM TC 99M TETROFOSMIN IV KIT
9.9000 | PACK | Freq: Once | INTRAVENOUS | Status: AC | PRN
  Administered 2021-06-16: 9.9 via INTRAVENOUS
  Filled 2021-06-16: qty 10

## 2021-06-20 ENCOUNTER — Telehealth: Payer: Self-pay | Admitting: Internal Medicine

## 2021-06-20 NOTE — Telephone Encounter (Signed)
-----   Message from Werner Lean, MD sent at 06/18/2021  2:48 PM EST ----- Normal Stress test ----- Message ----- From: Thayer Headings, MD Sent: 06/16/2021   2:23 PM EST To: Werner Lean, MD

## 2021-06-20 NOTE — Telephone Encounter (Signed)
The patient has been notified of the result and verbalized understanding.  All questions (if any) were answered. Nuala Alpha, LPN 6/42/9037 9:55 PM

## 2021-06-20 NOTE — Telephone Encounter (Signed)
Patient is calling requesting results from 02/16 testing.

## 2021-06-21 ENCOUNTER — Ambulatory Visit (HOSPITAL_COMMUNITY)
Admission: RE | Admit: 2021-06-21 | Discharge: 2021-06-21 | Disposition: A | Discharge status: Home / Self Care | Payer: Medicare HMO | Source: Ambulatory Visit | Attending: Cardiovascular Disease | Admitting: Cardiovascular Disease

## 2021-06-21 ENCOUNTER — Other Ambulatory Visit: Payer: Self-pay

## 2021-06-21 DIAGNOSIS — R079 Chest pain, unspecified: Secondary | ICD-10-CM | POA: Diagnosis not present

## 2021-06-21 DIAGNOSIS — I700 Atherosclerosis of aorta: Secondary | ICD-10-CM | POA: Diagnosis not present

## 2021-06-21 DIAGNOSIS — I714 Abdominal aortic aneurysm, without rupture, unspecified: Secondary | ICD-10-CM

## 2021-06-23 NOTE — Telephone Encounter (Signed)
Pt returned Itamar device on 06/21/21. Pt placed a hand written note on the device box, states:  Timothy [name]-Dr. C I don't have apnea This app is awkward Here is the test back  Rozell Searing  I will un-register the device today and put back into stock. I will update Dr. Gasper Sells pt returned device.

## 2021-06-24 NOTE — Telephone Encounter (Signed)
PER A[employer] MEDICARE NO PA REQUIRED FOR HST

## 2021-06-27 ENCOUNTER — Telehealth: Payer: Self-pay

## 2021-06-27 NOTE — Telephone Encounter (Signed)
-----   Message from Werner Lean, MD sent at 06/26/2021  3:39 PM EST ----- Repeat study in three years then perhaps only if new issues (Abdominal aorta is at the 2.9, mild dilation is 3.0) ----- Message ----- From: Interface, Three One Seven Sent: 06/21/2021   1:36 PM EST To: Werner Lean, MD

## 2021-06-27 NOTE — Telephone Encounter (Signed)
The patient has been notified of the result and verbalized understanding.  All questions (if any) were answered. Precious Gilding, RN 06/27/2021 1:43 PM   Pt notified that repeat AAA duplex is due in 3 years.

## 2021-06-28 ENCOUNTER — Ambulatory Visit: Payer: Medicare Other | Admitting: Cardiovascular Disease

## 2021-07-21 ENCOUNTER — Other Ambulatory Visit (HOSPITAL_COMMUNITY): Payer: Self-pay | Admitting: Internal Medicine

## 2021-07-21 DIAGNOSIS — I7143 Infrarenal abdominal aortic aneurysm, without rupture: Secondary | ICD-10-CM

## 2021-09-19 ENCOUNTER — Ambulatory Visit: Payer: Medicare HMO | Admitting: Internal Medicine

## 2021-09-27 DIAGNOSIS — N270 Small kidney, unilateral: Secondary | ICD-10-CM | POA: Diagnosis not present

## 2021-09-27 DIAGNOSIS — N200 Calculus of kidney: Secondary | ICD-10-CM | POA: Diagnosis not present

## 2021-12-02 ENCOUNTER — Ambulatory Visit: Payer: Medicare HMO | Admitting: Internal Medicine

## 2022-01-03 NOTE — Progress Notes (Unsigned)
Cardiology Office Note:    Date:  01/04/2022   ID:  Timothy [name], DOB [birth year], MRN [id number]  PCP:  Caryl Bis, MD   [facility] Providers Cardiologist:  Werner Lean, MD     Referring MD: Haywood Pao, MD   CC: HTN f/u  History of Present Illness:    Timothy [name] is a 71 y.o. male with a hx of Mild AAA (3.0 at last evaluation), LVH with HTN, HLD, who presents to re-establish care. 2023: have negative stress test.  AAA was 29 mm.  Saw HTN clinic and started norvasc.  Sleep study deferred- didn't have a great experience with the sleep stay. Did not get the renal duplex; his BP is controlled  Patient notes that he is doing poorly.   Notes that for the last week has have new shoulder pain. Has been swimming more this summer; and has had new pain.   There are no interval hospital/ED visit.    No chest pain or pressure.  No SOB/DOE and no PND/Orthopnea.  No weight gain or leg swelling.  No palpitations or syncope.  Pain does not occur with swimming.  Can occur randomly. Does more yard work this summer. This does not bring on the shoulder pain.  Amb BP: 120/82  Past Medical History:  Diagnosis Date   Abdominal aortic ectasia (HCC) MONITORED BY CARDIOLOGIST-  DR Johnsie Cancel   3.0 cm      PER DUPLEX 10-15-2013   DDD (degenerative disc disease), cervical    Diverticulosis of colon    GERD (gastroesophageal reflux disease)    History of adenomatous polyp of colon    History of kidney stones    History of prostatitis    RECURRENT   Hypertension    IC (interstitial cystitis)    Left ureteral stone    Nephrolithiasis    Seasonal allergic rhinitis    Wears glasses     Past Surgical History:  Procedure Laterality Date   COLONOSCOPY  last one 11-25-2013   CYSTOSCOPY W/ RETROGRADES Left 12/04/2014   Procedure:  RETROGRADE PYELOGRAM;  Surgeon: Kathie Rhodes, MD;  Location: [facility];  Service: Urology;  Laterality: Left;   CYSTOSCOPY WITH  HOLMIUM LASER LITHOTRIPSY Left 12/04/2014   Procedure: CYSTOSCOPY WITH HOLMIUM LASER LITHOTRIPSY;  Surgeon: Kathie Rhodes, MD;  Location: [facility];  Service: Urology;  Laterality: Left;   CYSTOSCOPY WITH RETROGRADE PYELOGRAM, URETEROSCOPY AND STENT PLACEMENT Left 10/30/2014   Procedure: CYSTO, LEFT RETROGRADE PYELOGRAM, URETEROSCOPY AND STENT PLACEMENT;  Surgeon: Kathie Rhodes, MD;  Location: [place];  Service: Urology;  Laterality: Left;   CYSTOSCOPY WITH URETEROSCOPY AND STENT PLACEMENT Left 12/04/2014   Procedure: CYSTOSCOPY WITH URETEROSCOPY WITH STENT REPLACEMENT;  Surgeon: Kathie Rhodes, MD;  Location: [facility];  Service: Urology;  Laterality: Left;   HOLMIUM LASER APPLICATION Left 06/03/3359   Procedure: LEFT HOLMIUM LASER LITHO;  Surgeon: Kathie Rhodes, MD;  Location: [facility];  Service: Urology;  Laterality: Left;   KNEE ARTHROSCOPY Right 06-14-2002   LAPAROSCOPIC CHOLECYSTECTOMY  01-16-2000   PILONIDAL CYST EXCISION  1995   STONE EXTRACTION WITH BASKET Left 12/04/2014   Procedure: STONE EXTRACTION WITH BASKET;  Surgeon: Kathie Rhodes, MD;  Location: [facility];  Service: Urology;  Laterality: Left;   TONSILLECTOMY  as child   TRANSTHORACIC ECHOCARDIOGRAM  12-20-2010   normal echo, ef 65%    Current Medications: Current Meds  Medication Sig  amLODipine (NORVASC) 5 MG tablet Take 1 tablet (5 mg total) by mouth daily.   aspirin EC 81 MG tablet Take 1 tablet (81 mg total) by mouth daily.   cyclobenzaprine (FLEXERIL) 10 MG tablet Take 10 mg by mouth at bedtime.   famotidine (PEPCID) 40 MG tablet Take 40 mg by mouth daily.   losartan (COZAAR) 100 MG tablet Take 100 mg by mouth every morning.   metoprolol tartrate (LOPRESSOR) 25 MG tablet Take 0.5 tablets (12.5 mg total) by mouth 2 (two) times daily.   nitroGLYCERIN (NITROSTAT) 0.4 MG SL tablet Place 1 tablet (0.4 mg total) under the tongue every 5 (five) minutes  as needed for chest pain.   tamsulosin (FLOMAX) 0.4 MG CAPS capsule as needed. Kidney stones     Allergies:   Doxycycline, Dye fdc blue [brilliant blue fcf (fd&c blue #1)], Penicillins, Hydrochlorothiazide, Septra [sulfamethoxazole-trimethoprim], and Sulfa antibiotics   Social History   Socioeconomic History   Marital status: Married    Spouse name: Not on file   Number of children: Not on file   Years of education: Not on file   Highest education level: Not on file  Occupational History    Employer: SELF-EMPLOYED    Comment: Art conservation  Tobacco Use   Smoking status: Former    Packs/day: 1.00    Years: 32.00    Total pack years: 32.00    Types: Cigarettes    Quit date: 05/02/2011    Years since quitting: 10.6   Smokeless tobacco: Never  Substance and Sexual Activity   Alcohol use: Yes    Alcohol/week: 6.0 standard drinks of alcohol    Types: 6 Cans of beer per week   Drug use: No   Sexual activity: Not on file  Other Topics Concern   Not on file  Social History Narrative   Not on file   Social Determinants of Health   Financial Resource Strain: Not on file  Food Insecurity: Not on file  Transportation Needs: Not on file  Physical Activity: Not on file  Stress: Not on file  Social Connections: Not on file     Family History: The patient's family history includes Aneurysm (age of onset: 22) in his father; Cancer in his paternal uncle; Colitis in his mother; Heart failure in his mother. There is no history of Diabetes or Stroke.  ROS:   Please see the history of present illness.     All other systems reviewed and are negative.  EKGs/Labs/Other Studies Reviewed:    The following studies were reviewed today:  EKG:   06/09/21: Sinus tachycardia   GATED SPECT MYO PERF W/EXERCISE STRESS 1D 06/16/2021  Narrative   The study is normal. The study is low risk.   LV perfusion is normal. There is no evidence of ischemia.   Left ventricular function is normal. End  diastolic cavity size is normal.   Prior study not available for comparison.   Patient walked on a standard Bruce protocol treadmill test for a total of 7 minutes and 30 seconds.  He achieved a peak heart rate of 133 which is 89% predicted maximal heart rate.   At peak exercise there were no ST or T wave changes to suggest ischemia.  There was no QRS widening at peak exercise.   Nuclear stress EF: 65 %. The left ventricular ejection fraction is normal (55-65%).   This is interpreted as a negative stress Myoview study.  There is no evidence of ischemia or previous infarction.  Recent Labs: No results found for requested labs within last 365 days.  Recent Lipid Panel    Component Value Date/Time   CHOL 240 (H) 07/16/2014 0914   TRIG 109 07/16/2014 0914   HDL 59 07/16/2014 0914   CHOLHDL 3.1 Ratio 07/24/2008 2030   VLDL 13 07/24/2008 2030   LDLCALC 159 (H) 07/16/2014 0914   LDLDIRECT 140.3 06/06/2006 1039        Physical Exam:    VS:  BP (!) 140/80   Pulse 78   Ht '5\' 10"'$  (1.778 m)   Wt 179 lb (81.2 kg)   SpO2 98%   BMI 25.68 kg/m     Wt Readings from Last 3 Encounters:  01/04/22 179 lb (81.2 kg)  06/16/21 187 lb (84.8 kg)  06/09/21 187 lb 3.2 oz (84.9 kg)    Gen: No distress  Neck: No JVD Ears: Bilateral Pilar Plate Sign Cardiac: No Rubs or Gallops, no Murmur, RRR +2 radial pulses Respiratory: Clear to auscultation bilaterally, normal effort, normal  respiratory rate GI: Soft, nontender, non-distended  MS: No  edema;  moves all extremities Integument: Skin feels warm Neuro:  At time of evaluation, alert and oriented to person/place/time/situation  Psych: Normal affect, patient feels unwell  ASSESSMENT:    1. Precordial pain   2. Primary hypertension   3. Mixed hyperlipidemia     PLAN:    Shoulder Pain - negative stress test this year - will add PRN nitroglycerin; if this improves his shoulder pain we will get a cardiac CT; otherwise this is likely  non-cardiac  HTN HLD - BP has greatly improved with norvasc 5; argues against OSA component of BP and or renal arterial hypertension - will continue norvasx 5 mg, Losartan 100 mg - adding low dose metoprolol - ambulatory BP at goal  AAA (borderline- 2.9 cm) - potential eval in three years   Medication Adjustments/Labs and Tests Ordered: Current medicines are reviewed at length with the patient today.  Concerns regarding medicines are outlined above.  No orders of the defined types were placed in this encounter.  Meds ordered this encounter  Medications   metoprolol tartrate (LOPRESSOR) 25 MG tablet    Sig: Take 0.5 tablets (12.5 mg total) by mouth 2 (two) times daily.    Dispense:  90 tablet    Refill:  3   nitroGLYCERIN (NITROSTAT) 0.4 MG SL tablet    Sig: Place 1 tablet (0.4 mg total) under the tongue every 5 (five) minutes as needed for chest pain.    Dispense:  25 tablet    Refill:  3    Patient Instructions  Medication Instructions:  Your physician has recommended you make the following change in your medication:  START: Metoprolol tartrate (Lopresor) 12.5 mg by mouth twice daily  START: Nitroglycerin 0.4 mg under your tongue as needed for Chest Pain- - may use up to 3 tablets  If you have chest pain place 1 tablet under your tongue, wait 5 min If chest pain continues place a 2nd tablet under your tongue, wait 5 min If chest pain continues place a 3rd tablet under your tongue, wait 5 min If chest pain continues call 911  *If you need a refill on your cardiac medications before your next appointment, please call your pharmacy*   Lab Work: NONE If you have labs (blood work) drawn today and your tests are completely normal, you will receive your results only by: [place] (if you have MyChart) OR A paper copy in  the mail If you have any lab test that is abnormal or we need to change your treatment, we will call you to review the  results.   Testing/Procedures: Please call back in 2 weeks if your shoulder pain is better after using Nitroglycerin  Follow-Up: At [facility], you and your health needs are our priority.  As part of our continuing mission to provide you with exceptional heart care, we have created designated Provider Care Teams.  These Care Teams include your primary Cardiologist (physician) and Advanced Practice Providers (APPs -  Physician Assistants and Nurse Practitioners) who all work together to provide you with the care you need, when you need it.  We recommend signing up for the patient portal called "MyChart".  Sign up information is provided on this After Visit Summary.  MyChart is used to connect with patients for Virtual Visits (Telemedicine).  Patients are able to view lab/test results, encounter notes, upcoming appointments, etc.  Non-urgent messages can be sent to your provider as well.   To learn more about what you can do with MyChart, go to [email or web address].    Your next appointment:   3 month(s)  The format for your next appointment:   In Person  Provider:   Werner Lean, MD     Important Information About Sugar         Signed, Werner Lean, MD  01/04/2022 9:49 AM    [place]

## 2022-01-04 ENCOUNTER — Ambulatory Visit: Payer: Medicare HMO | Attending: Internal Medicine | Admitting: Internal Medicine

## 2022-01-04 ENCOUNTER — Encounter: Payer: Self-pay | Admitting: Internal Medicine

## 2022-01-04 VITALS — BP 140/80 | HR 78 | Ht 70.0 in | Wt 179.0 lb

## 2022-01-04 DIAGNOSIS — I10 Essential (primary) hypertension: Secondary | ICD-10-CM

## 2022-01-04 DIAGNOSIS — R072 Precordial pain: Secondary | ICD-10-CM | POA: Diagnosis not present

## 2022-01-04 DIAGNOSIS — E782 Mixed hyperlipidemia: Secondary | ICD-10-CM | POA: Diagnosis not present

## 2022-01-04 MED ORDER — METOPROLOL TARTRATE 25 MG PO TABS: 12.5000 mg | ORAL_TABLET | Freq: Two times a day (BID) | ORAL | 3 refills | Status: DC

## 2022-01-04 MED ORDER — NITROGLYCERIN 0.4 MG SL SUBL: 0.4000 mg | SUBLINGUAL_TABLET | SUBLINGUAL | 3 refills | Status: DC | PRN

## 2022-01-04 NOTE — Patient Instructions (Addendum)
Medication Instructions:  Your physician has recommended you make the following change in your medication:  START: Metoprolol tartrate (Lopresor) 12.5 mg by mouth twice daily  START: Nitroglycerin 0.4 mg under your tongue as needed for Chest Pain- - may use up to 3 tablets  If you have chest pain place 1 tablet under your tongue, wait 5 min If chest pain continues place a 2nd tablet under your tongue, wait 5 min If chest pain continues place a 3rd tablet under your tongue, wait 5 min If chest pain continues call 911  *If you need a refill on your cardiac medications before your next appointment, please call your pharmacy*   Lab Work: NONE If you have labs (blood work) drawn today and your tests are completely normal, you will receive your results only by: [place] (if you have MyChart) OR A paper copy in the mail If you have any lab test that is abnormal or we need to change your treatment, we will call you to review the results.   Testing/Procedures: Please call back in 2 weeks if your shoulder pain is better after using Nitroglycerin  Follow-Up: At [facility], you and your health needs are our priority.  As part of our continuing mission to provide you with exceptional heart care, we have created designated Provider Care Teams.  These Care Teams include your primary Cardiologist (physician) and Advanced Practice Providers (APPs -  Physician Assistants and Nurse Practitioners) who all work together to provide you with the care you need, when you need it.  We recommend signing up for the patient portal called "MyChart".  Sign up information is provided on this After Visit Summary.  MyChart is used to connect with patients for Virtual Visits (Telemedicine).  Patients are able to view lab/test results, encounter notes, upcoming appointments, etc.  Non-urgent messages can be sent to your provider as well.   To learn more about what you can do with MyChart, go to  [email or web address].    Your next appointment:   3 month(s)  The format for your next appointment:   In Person  Provider:   Werner Lean, MD     Important Information About Sugar

## 2022-02-13 ENCOUNTER — Other Ambulatory Visit: Payer: Self-pay | Admitting: Internal Medicine

## 2022-03-15 DIAGNOSIS — H4321 Crystalline deposits in vitreous body, right eye: Secondary | ICD-10-CM | POA: Diagnosis not present

## 2022-03-15 DIAGNOSIS — H2513 Age-related nuclear cataract, bilateral: Secondary | ICD-10-CM | POA: Diagnosis not present

## 2022-03-15 DIAGNOSIS — H52203 Unspecified astigmatism, bilateral: Secondary | ICD-10-CM | POA: Diagnosis not present

## 2022-03-15 DIAGNOSIS — H524 Presbyopia: Secondary | ICD-10-CM | POA: Diagnosis not present

## 2022-04-13 NOTE — Progress Notes (Signed)
Cardiology Office Note:    Date:  04/14/2022   ID:  MIKLO AKEN, DOB 30-Mar-1951, MRN [id number]  PCP:  Caryl Bis, MD   [facility] Providers Cardiologist:  Werner Lean, MD     Referring MD: Caryl Bis, MD   CC: HTN f/u  History of Present Illness:    Timothy [name] is a 71 y.o. male with a hx of Mild AAA (3.0 at last evaluation), LVH with HTN, HLD, who presents to re-establish care. 2023: have negative stress test.  AAA was 29 mm.  Saw HTN clinic and started norvasc.  Sleep study deferred- didn't have a great experience with the sleep stay. Did not get the renal duplex; his BP is controlled given PRN nitro for shoulder pain  Patient notes that he is doing well.   Still gets a shoulder twice that improves with improved posturing. There are no interval hospital/ED visit.    No chest pain or pressure .  No SOB/DOE and no PND/Orthopnea.  No weight gain or leg swelling.  No palpitations or syncope .   Past Medical History:  Diagnosis Date   Abdominal aortic ectasia (HCC) MONITORED BY CARDIOLOGIST-  DR Johnsie Cancel   3.0 cm      PER DUPLEX 10-15-2013   DDD (degenerative disc disease), cervical    Diverticulosis of colon    GERD (gastroesophageal reflux disease)    History of adenomatous polyp of colon    History of kidney stones    History of prostatitis    RECURRENT   Hypertension    IC (interstitial cystitis)    Left ureteral stone    Nephrolithiasis    Seasonal allergic rhinitis    Wears glasses     Past Surgical History:  Procedure Laterality Date   COLONOSCOPY  last one 11-25-2013   CYSTOSCOPY W/ RETROGRADES Left 12/04/2014   Procedure:  RETROGRADE PYELOGRAM;  Surgeon: Kathie Rhodes, MD;  Location: [facility];  Service: Urology;  Laterality: Left;   CYSTOSCOPY WITH HOLMIUM LASER LITHOTRIPSY Left 12/04/2014   Procedure: CYSTOSCOPY WITH HOLMIUM LASER LITHOTRIPSY;  Surgeon: Kathie Rhodes, MD;  Location: [facility];   Service: Urology;  Laterality: Left;   CYSTOSCOPY WITH RETROGRADE PYELOGRAM, URETEROSCOPY AND STENT PLACEMENT Left 10/30/2014   Procedure: CYSTO, LEFT RETROGRADE PYELOGRAM, URETEROSCOPY AND STENT PLACEMENT;  Surgeon: Kathie Rhodes, MD;  Location: [place];  Service: Urology;  Laterality: Left;   CYSTOSCOPY WITH URETEROSCOPY AND STENT PLACEMENT Left 12/04/2014   Procedure: CYSTOSCOPY WITH URETEROSCOPY WITH STENT REPLACEMENT;  Surgeon: Kathie Rhodes, MD;  Location: [facility];  Service: Urology;  Laterality: Left;   HOLMIUM LASER APPLICATION Left 09/30/8313   Procedure: LEFT HOLMIUM LASER LITHO;  Surgeon: Kathie Rhodes, MD;  Location: [facility];  Service: Urology;  Laterality: Left;   KNEE ARTHROSCOPY Right 06-14-2002   LAPAROSCOPIC CHOLECYSTECTOMY  01-16-2000   PILONIDAL CYST EXCISION  1995   STONE EXTRACTION WITH BASKET Left 12/04/2014   Procedure: STONE EXTRACTION WITH BASKET;  Surgeon: Kathie Rhodes, MD;  Location: [facility];  Service: Urology;  Laterality: Left;   TONSILLECTOMY  as child   TRANSTHORACIC ECHOCARDIOGRAM  12-20-2010   normal echo, ef 65%    Current Medications: Current Meds  Medication Sig   amLODipine (NORVASC) 5 MG tablet TAKE 1 TABLET (5 MG TOTAL) BY MOUTH DAILY.   aspirin EC 81 MG tablet Take 1 tablet (81 mg total) by mouth daily.   cyclobenzaprine (  FLEXERIL) 10 MG tablet Take 10 mg by mouth at bedtime.   famotidine (PEPCID) 40 MG tablet Take 40 mg by mouth daily.   losartan (COZAAR) 100 MG tablet Take 100 mg by mouth every morning.   tamsulosin (FLOMAX) 0.4 MG CAPS capsule as needed. Kidney stones   [DISCONTINUED] metoprolol tartrate (LOPRESSOR) 25 MG tablet Take 0.5 tablets (12.5 mg total) by mouth 2 (two) times daily.   [DISCONTINUED] nitroGLYCERIN (NITROSTAT) 0.4 MG SL tablet Place 1 tablet (0.4 mg total) under the tongue every 5 (five) minutes as needed for chest pain.     Allergies:   Doxycycline, Dye fdc  blue [brilliant blue fcf (fd&c blue #1)], Penicillins, Hydrochlorothiazide, Septra [sulfamethoxazole-trimethoprim], and Sulfa antibiotics   Social History   Socioeconomic History   Marital status: Married    Spouse name: Not on file   Number of children: Not on file   Years of education: Not on file   Highest education level: Not on file  Occupational History    Employer: SELF-EMPLOYED    Comment: Art conservation  Tobacco Use   Smoking status: Former    Packs/day: 1.00    Years: 32.00    Total pack years: 32.00    Types: Cigarettes    Quit date: 05/02/2011    Years since quitting: 10.9   Smokeless tobacco: Never  Substance and Sexual Activity   Alcohol use: Yes    Alcohol/week: 6.0 standard drinks of alcohol    Types: 6 Cans of beer per week   Drug use: No   Sexual activity: Not on file  Other Topics Concern   Not on file  Social History Narrative   Not on file   Social Determinants of Health   Financial Resource Strain: Not on file  Food Insecurity: Not on file  Transportation Needs: Not on file  Physical Activity: Not on file  Stress: Not on file  Social Connections: Not on file     Family History: The patient's family history includes Aneurysm (age of onset: 62) in his father; Cancer in his paternal uncle; Colitis in his mother; Heart failure in his mother. There is no history of Diabetes or Stroke.  ROS:   Please see the history of present illness.     All other systems reviewed and are negative.  EKGs/Labs/Other Studies Reviewed:    The following studies were reviewed today:  EKG:   06/09/21: Sinus tachycardia  Cardiac Studies & Procedures     STRESS TESTS  MYOCARDIAL PERFUSION IMAGING 06/16/2021  Narrative   The study is normal. The study is low risk.   LV perfusion is normal. There is no evidence of ischemia.   Left ventricular function is normal. End diastolic cavity size is normal.   Prior study not available for comparison.   Patient walked on  a standard Bruce protocol treadmill test for a total of 7 minutes and 30 seconds.  He achieved a peak heart rate of 133 which is 89% predicted maximal heart rate.   At peak exercise there were no ST or T wave changes to suggest ischemia.  There was no QRS widening at peak exercise.   Nuclear stress EF: 65 %. The left ventricular ejection fraction is normal (55-65%).   This is interpreted as a negative stress Myoview study.  There is no evidence of ischemia or previous infarction.              Recent Labs: No results found for requested labs within last 365 days.  Recent Lipid Panel    Component Value Date/Time   CHOL 240 (H) 07/16/2014 0914   TRIG 109 07/16/2014 0914   HDL 59 07/16/2014 0914   CHOLHDL 3.1 Ratio 07/24/2008 2030   VLDL 13 07/24/2008 2030   LDLCALC 159 (H) 07/16/2014 0914   LDLDIRECT 140.3 06/06/2006 1039        Physical Exam:    VS:  BP 130/70   Pulse 76   Ht '5\' 10"'$  (1.778 m)   Wt 186 lb (84.4 kg)   SpO2 98%   BMI 26.69 kg/m     Wt Readings from Last 3 Encounters:  04/14/22 186 lb (84.4 kg)  01/04/22 179 lb (81.2 kg)  06/16/21 187 lb (84.8 kg)    Gen: No distress  Neck: No JVD Ears: Bilateral Pilar Plate Sign Cardiac: No Rubs or Gallops, no Murmur, RRR +2 radial pulses Respiratory: Clear to auscultation bilaterally, normal effort, normal  respiratory rate GI: Soft, nontender, non-distended  MS: No  edema;  moves all extremities Integument: Skin feels warm Neuro:  At time of evaluation, alert and oriented to person/place/time/situation  Psych: Normal affect, patient feels unwell  ASSESSMENT:    1. Primary hypertension   2. Mixed hyperlipidemia   3. Left ventricular hypertrophy   4. ABDOMINAL AORTIC ECTASIA   5. Elevated lipids     PLAN:    HTN with LVH HLD - norvasc 5 mg;  losartan 100 mg - stop metoprolol - ambulatory BP at goal - Swims in the summers, treadmill on the winter (210 minutes a day) - we discussed endurance training; limitations  from his hip and knee - discussed dietary changes - we have discussed options for preventions; after SDM: we will check LDL direct and Lp(a) today, he will start endurance changes cut the granola bars, and make other dietary chagnes, if still elevated above goal in April 2024 will start rosuvastatin 5 mg (fasting lipids then) - stop PRN ntitro  AAA (borderline- 2.9 cm) - potential eval in three years   Medication Adjustments/Labs and Tests Ordered: Current medicines are reviewed at length with the patient today.  Concerns regarding medicines are outlined above.  Orders Placed This Encounter  Procedures   Lipid panel   ALT   LDL cholesterol, direct   No orders of the defined types were placed in this encounter.   Patient Instructions  Medication Instructions:  Your physician has recommended you make the following change in your medication:  Stop taking nitroglycerin  Stop taking metoprolol  *If you need a refill on your cardiac medications before your next appointment, please call your pharmacy*   Lab Work: LDL-Direct and LPa today Fasting Lipids in April  If you have labs (blood work) drawn today and your tests are completely normal, you will receive your results only by: [place] (if you have MyChart) OR A paper copy in the mail If you have any lab test that is abnormal or we need to change your treatment, we will call you to review the results.    Follow-Up: At [facility], you and your health needs are our priority.  As part of our continuing mission to provide you with exceptional heart care, we have created designated Provider Care Teams.  These Care Teams include your primary Cardiologist (physician) and Advanced Practice Providers (APPs -  Physician Assistants and Nurse Practitioners) who all work together to provide you with the care you need, when you need it.  We recommend signing up for the patient  portal called "MyChart".  Sign up information is  provided on this After Visit Summary.  MyChart is used to connect with patients for Virtual Visits (Telemedicine).  Patients are able to view lab/test results, encounter notes, upcoming appointments, etc.  Non-urgent messages can be sent to your provider as well.   To learn more about what you can do with MyChart, go to [email or web address].    Your next appointment:   1 year(s)  The format for your next appointment:   In Person  Provider:   Werner Lean, MD     Important Information About Sugar         Signed, Werner Lean, MD  04/14/2022 9:25 AM    [place]

## 2022-04-14 ENCOUNTER — Encounter: Payer: Self-pay | Admitting: Internal Medicine

## 2022-04-14 ENCOUNTER — Ambulatory Visit: Payer: Medicare HMO | Attending: Internal Medicine | Admitting: Internal Medicine

## 2022-04-14 VITALS — BP 130/70 | HR 76 | Ht 70.0 in | Wt 186.0 lb

## 2022-04-14 DIAGNOSIS — I77811 Abdominal aortic ectasia: Secondary | ICD-10-CM | POA: Diagnosis not present

## 2022-04-14 DIAGNOSIS — E782 Mixed hyperlipidemia: Secondary | ICD-10-CM | POA: Diagnosis not present

## 2022-04-14 DIAGNOSIS — I10 Essential (primary) hypertension: Secondary | ICD-10-CM

## 2022-04-14 DIAGNOSIS — E785 Hyperlipidemia, unspecified: Secondary | ICD-10-CM | POA: Diagnosis not present

## 2022-04-14 DIAGNOSIS — I517 Cardiomegaly: Secondary | ICD-10-CM | POA: Diagnosis not present

## 2022-04-14 LAB — ALT: ALT: 17 IU/L (ref 0–44)

## 2022-04-14 LAB — LDL CHOLESTEROL, DIRECT: LDL Direct: 134 mg/dL — ABNORMAL HIGH (ref 0–99)

## 2022-04-14 NOTE — Patient Instructions (Signed)
Medication Instructions:  Your physician has recommended you make the following change in your medication:  Stop taking nitroglycerin  Stop taking metoprolol  *If you need a refill on your cardiac medications before your next appointment, please call your pharmacy*   Lab Work: LDL-Direct and [facility] today Fasting Lipids in April  If you have labs (blood work) drawn today and your tests are completely normal, you will receive your results only by: [place] (if you have MyChart) OR A paper copy in the mail If you have any lab test that is abnormal or we need to change your treatment, we will call you to review the results.    Follow-Up: At [facility], you and your health needs are our priority.  As part of our continuing mission to provide you with exceptional heart care, we have created designated Provider Care Teams.  These Care Teams include your primary Cardiologist (physician) and Advanced Practice Providers (APPs -  Physician Assistants and Nurse Practitioners) who all work together to provide you with the care you need, when you need it.  We recommend signing up for the patient portal called "MyChart".  Sign up information is provided on this After Visit Summary.  MyChart is used to connect with patients for Virtual Visits (Telemedicine).  Patients are able to view lab/test results, encounter notes, upcoming appointments, etc.  Non-urgent messages can be sent to your provider as well.   To learn more about what you can do with MyChart, go to [email or web address].    Your next appointment:   1 year(s)  The format for your next appointment:   In Person  Provider:   Werner Lean, MD     Important Information About Sugar

## 2022-04-25 ENCOUNTER — Telehealth: Payer: Self-pay | Admitting: Internal Medicine

## 2022-04-25 ENCOUNTER — Other Ambulatory Visit: Payer: Self-pay | Admitting: *Deleted

## 2022-04-25 DIAGNOSIS — E782 Mixed hyperlipidemia: Secondary | ICD-10-CM

## 2022-04-25 NOTE — Telephone Encounter (Signed)
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

## 2022-04-25 NOTE — Telephone Encounter (Signed)
Pt aware of lab results ./cy 

## 2022-04-25 NOTE — Telephone Encounter (Signed)
Patient is returning RN's call from last week regarding lab results. Please advise.

## 2022-07-05 ENCOUNTER — Ambulatory Visit (HOSPITAL_COMMUNITY)
Admission: RE | Admit: 2022-07-05 | Discharge: 2022-07-05 | Disposition: A | Discharge status: Home / Self Care | Payer: Medicare HMO | Source: Ambulatory Visit | Attending: Internal Medicine | Admitting: Internal Medicine

## 2022-07-05 DIAGNOSIS — I7143 Infrarenal abdominal aortic aneurysm, without rupture: Secondary | ICD-10-CM | POA: Insufficient documentation

## 2022-08-08 ENCOUNTER — Ambulatory Visit: Payer: Medicare HMO

## 2022-09-18 ENCOUNTER — Ambulatory Visit: Payer: Medicare HMO

## 2022-10-07 DIAGNOSIS — Z1212 Encounter for screening for malignant neoplasm of rectum: Secondary | ICD-10-CM | POA: Diagnosis not present

## 2022-10-07 DIAGNOSIS — Z0001 Encounter for general adult medical examination with abnormal findings: Secondary | ICD-10-CM | POA: Diagnosis not present

## 2022-10-07 DIAGNOSIS — Z125 Encounter for screening for malignant neoplasm of prostate: Secondary | ICD-10-CM | POA: Diagnosis not present

## 2022-10-07 DIAGNOSIS — E559 Vitamin D deficiency, unspecified: Secondary | ICD-10-CM | POA: Diagnosis not present

## 2022-10-07 DIAGNOSIS — E782 Mixed hyperlipidemia: Secondary | ICD-10-CM | POA: Diagnosis not present

## 2022-10-07 DIAGNOSIS — R300 Dysuria: Secondary | ICD-10-CM | POA: Diagnosis not present

## 2022-10-07 DIAGNOSIS — Z6825 Body mass index (BMI) 25.0-25.9, adult: Secondary | ICD-10-CM | POA: Diagnosis not present

## 2022-10-07 DIAGNOSIS — E7849 Other hyperlipidemia: Secondary | ICD-10-CM | POA: Diagnosis not present

## 2022-10-07 DIAGNOSIS — R030 Elevated blood-pressure reading, without diagnosis of hypertension: Secondary | ICD-10-CM | POA: Diagnosis not present

## 2023-02-27 ENCOUNTER — Other Ambulatory Visit: Payer: Self-pay | Admitting: Internal Medicine

## 2023-11-20 DIAGNOSIS — Z1212 Encounter for screening for malignant neoplasm of rectum: Secondary | ICD-10-CM | POA: Diagnosis not present

## 2023-11-20 DIAGNOSIS — Z1211 Encounter for screening for malignant neoplasm of colon: Secondary | ICD-10-CM | POA: Diagnosis not present

## 2023-11-26 LAB — COLOGUARD: COLOGUARD: POSITIVE — AB

## 2023-11-28 ENCOUNTER — Encounter: Payer: Self-pay | Admitting: Internal Medicine

## 2023-12-12 ENCOUNTER — Encounter: Payer: Self-pay | Admitting: Internal Medicine

## 2023-12-12 ENCOUNTER — Ambulatory Visit

## 2023-12-12 VITALS — Ht 70.0 in | Wt 180.0 lb

## 2023-12-12 DIAGNOSIS — Z860100 Personal history of colon polyps, unspecified: Secondary | ICD-10-CM

## 2023-12-12 MED ORDER — NA SULFATE-K SULFATE-MG SULF 17.5-3.13-1.6 GM/177ML PO SOLN: 1.0000 | Freq: Once | ORAL | 0 refills | Status: AC

## 2023-12-12 NOTE — Progress Notes (Signed)
 No egg or soy allergy known to patient  No issues known to pt with past sedation with any surgeries or procedures Patient denies ever being told they had issues or difficulty with intubation  No FH of Malignant Hyperthermia Pt is not on diet pills Pt is not on  home 02  Pt is not on blood thinners  Pt denies issues with constipation  No A fib or A flutter Have any cardiac testing pending-- no  LOA: independent  Prep: suprep   Patient's chart reviewed by Norleen Schillings CNRA prior to previsit and patient appropriate for the [facility].  Previsit completed and red dot placed by patient's name on their procedure day (on provider's schedule).     PV completed with patient. Prep instructions sent to home address.

## 2023-12-23 ENCOUNTER — Encounter: Payer: Self-pay | Admitting: Internal Medicine

## 2023-12-26 ENCOUNTER — Other Ambulatory Visit: Payer: Self-pay

## 2023-12-26 ENCOUNTER — Telehealth: Payer: Self-pay | Admitting: Internal Medicine

## 2023-12-26 ENCOUNTER — Encounter: Admitting: Internal Medicine

## 2023-12-26 NOTE — Telephone Encounter (Signed)
 Call from pt, states he had called Dr Abran during the night due to allergic reaction, face swelling and red, states Dr Abran had him take benadryl and he felt better, however, he would like to r/s instead of trying miralax prep today, states he has taken miralax in the past.  R/s pt and pt verb understanding

## 2023-12-26 NOTE — Telephone Encounter (Signed)
 Patient called Dr. Abran last night, he was itchy taking the Suprep.  He thought it was a sulfa reaction though Suprep does not contain sulfa drug.   The patient wanted to cancel and reschedule.  Please reschedule him with a MiraLAX prep.

## 2023-12-27 ENCOUNTER — Other Ambulatory Visit: Payer: Self-pay | Admitting: *Deleted

## 2024-01-15 ENCOUNTER — Ambulatory Visit (AMBULATORY_SURGERY_CENTER)

## 2024-01-15 VITALS — Ht 70.0 in | Wt 170.0 lb

## 2024-01-15 DIAGNOSIS — Z860100 Personal history of colon polyps, unspecified: Secondary | ICD-10-CM

## 2024-01-15 MED ORDER — PLENVU 140 G PO SOLR: 1.0000 | ORAL | Status: DC

## 2024-01-15 NOTE — Progress Notes (Signed)

## 2024-01-21 ENCOUNTER — Telehealth: Payer: Self-pay | Admitting: Internal Medicine

## 2024-01-21 NOTE — Telephone Encounter (Signed)
 Patient calling in regards to prep medication. Please advise.

## 2024-01-21 NOTE — Telephone Encounter (Signed)
 RN returned call to patient is concerned about whether or not he will be able to tolerate Plenvu , due to his reaction to Suprep. Patient states his face became red, he had facial swelling, and itching. Pt states he contacted MD on call when this happened, then he took Benadryl, and was fine after that.  RN went over Plenvu  ingredients with patient, as well as Miralax ingredients. Patient is going to try one dose of Miralax well before his next scheduled procedure to see if he handles this without any reaction. If he does, he will call back to request that his prep be changed to Miralax. These prep instructions have been entered and pended in the event patient calls back to request this prep.

## 2024-01-30 ENCOUNTER — Encounter: Payer: Self-pay | Admitting: Internal Medicine

## 2024-01-30 NOTE — Telephone Encounter (Signed)
 Patient is requesting to change prep to Miralax prep. States he took Miralax sand did not have a reaction. Please advise, thank you

## 2024-01-30 NOTE — Telephone Encounter (Signed)
 Patient notified and instructions sent via email

## 2024-01-30 NOTE — Telephone Encounter (Signed)
 Please use MiraLax prep for this patient

## 2024-01-30 NOTE — Telephone Encounter (Signed)
 Good Morning Dr. Avram,  Please see email train below about patient wanting to switch preps to Miralax.  He tried the Miralax over the weekend and did not have any issues.  He would rather the Miralax if you are ok with this. Please advise. Thank you, Dessa Ledee

## 2024-02-06 NOTE — Progress Notes (Unsigned)
 [facility] History and Physical   Primary Care Physician:  Toribio Jerel MATSU, MD   Reason for Procedure:    Encounter Diagnosis  Name Primary?   History of colonic polyps Yes     Plan:    colonoscopy     HPI: Timothy [name] is a 73 y.o. male here for surveillance colonoscopy - hx diminutive adenoma removal 2015. Otherwise NL exam, No colonoscopy since. Has had sensitivity or reactions to colonoscopy preps with sulfites. MiraLax is tolerated.   Past Medical History:  Diagnosis Date   Abdominal aortic ectasia MONITORED BY CARDIOLOGIST-  DR DELFORD   3.0 cm      PER DUPLEX 10-15-2013   DDD (degenerative disc disease), cervical    Diverticulosis of colon    GERD (gastroesophageal reflux disease)    History of adenomatous polyp of colon    History of kidney stones    History of prostatitis    RECURRENT   Hypertension    IC (interstitial cystitis)    Left ureteral stone    Nephrolithiasis    Seasonal allergic rhinitis    Wears glasses     Past Surgical History:  Procedure Laterality Date   COLONOSCOPY  last one 11-25-2013   CYSTOSCOPY W/ RETROGRADES Left 12/04/2014   Procedure:  RETROGRADE PYELOGRAM;  Surgeon: Azhar Ottelin, MD;  Location: [facility];  Service: Urology;  Laterality: Left;   CYSTOSCOPY WITH HOLMIUM LASER LITHOTRIPSY Left 12/04/2014   Procedure: CYSTOSCOPY WITH HOLMIUM LASER LITHOTRIPSY;  Surgeon: Shaylon Ottelin, MD;  Location: [facility];  Service: Urology;  Laterality: Left;   CYSTOSCOPY WITH RETROGRADE PYELOGRAM, URETEROSCOPY AND STENT PLACEMENT Left 10/30/2014   Procedure: CYSTO, LEFT RETROGRADE PYELOGRAM, URETEROSCOPY AND STENT PLACEMENT;  Surgeon: Brighten Ottelin, MD;  Location: [facility];  Service: Urology;  Laterality: Left;   CYSTOSCOPY WITH URETEROSCOPY AND STENT PLACEMENT Left 12/04/2014   Procedure: CYSTOSCOPY WITH URETEROSCOPY WITH STENT REPLACEMENT;  Surgeon: Waris Ottelin, MD;  Location: [facility];  Service: Urology;  Laterality: Left;   HOLMIUM LASER APPLICATION Left 10/30/2014   Procedure: LEFT HOLMIUM LASER LITHO;  Surgeon: Waverly Ottelin, MD;  Location: [facility];  Service: Urology;  Laterality: Left;   KNEE ARTHROSCOPY Right 06-14-2002   LAPAROSCOPIC CHOLECYSTECTOMY  01-16-2000   PILONIDAL CYST EXCISION  1995   STONE EXTRACTION WITH BASKET Left 12/04/2014   Procedure: STONE EXTRACTION WITH BASKET;  Surgeon: Aadil Ottelin, MD;  Location: [facility];  Service: Urology;  Laterality: Left;   TONSILLECTOMY  as child   TRANSTHORACIC ECHOCARDIOGRAM  12-20-2010   normal echo, ef 65%     Current Outpatient Medications  Medication Sig Dispense Refill   amLODipine  (NORVASC ) 5 MG tablet TAKE 1 TABLET (5 MG TOTAL) BY MOUTH DAILY. 90 tablet 3   famotidine (PEPCID) 40 MG tablet Take 40 mg by mouth daily.     losartan  (COZAAR ) 100 MG tablet Take 100 mg by mouth every morning.  3   aspirin EC 81 MG tablet Take 1 tablet (81 mg total) by mouth daily.     cyclobenzaprine (FLEXERIL) 10 MG tablet Take 10 mg by mouth at bedtime. (Patient not taking: No sig reported)     nitroGLYCERIN  (NITROSTAT ) 0.4 MG SL tablet Place 0.4 mg under the tongue every 5 (five) minutes as needed.     PEG-KCl-NaCl-NaSulf-Na Asc-C (PLENVU ) 140 g SOLR Take 1 kit by mouth as directed. No PA's (Patient not taking: Reported on 02/07/2024) 1  each NO   tamsulosin  (FLOMAX ) 0.4 MG CAPS capsule as needed. Kidney stones     Current Facility-Administered Medications  Medication Dose Route Frequency Provider Last Rate Last Admin   0.9 %  sodium chloride  infusion  500 mL Intravenous Continuous Avram Lupita BRAVO, MD        Allergies as of 02/07/2024 - Review Complete 02/07/2024  Allergen Reaction Noted   Doxycycline Hives 08/15/2010   Dye fdc blue [brilliant blue fcf (fd&c blue #1)] Hives 12/15/2010   Hydrochlorothiazide  Hives 06/10/2021   Penicillins Hives 08/15/2010   Suprep bowel  prep kit [na sulfate-k sulfate-mg sulf] Itching and Swelling 12/26/2023   Septra [sulfamethoxazole-trimethoprim] Rash 10/28/2014   Sulfa antibiotics Rash 08/15/2010    Family History  Problem Relation Age of Onset   Heart failure Mother    Colitis Mother    Aneurysm Father 91       CNS aneursym   Cancer Paternal Uncle        ?bladder   Diabetes Neg Hx    Stroke Neg Hx    Rectal cancer Neg Hx    Stomach cancer Neg Hx    Colon cancer Neg Hx    Colon polyps Neg Hx    Esophageal cancer Neg Hx     Social History   Socioeconomic History   Marital status: Married    Spouse name: Not on file   Number of children: Not on file   Years of education: Not on file   Highest education level: Not on file  Occupational History    Employer: SELF-EMPLOYED    Comment: Art conservation  Tobacco Use   Smoking status: Former    Current packs/day: 0.00    Average packs/day: 1 pack/day for 32.0 years (32.0 ttl pk-yrs)    Types: Cigarettes    Start date: 05/02/1979    Quit date: 05/02/2011    Years since quitting: 12.7   Smokeless tobacco: Never  Vaping Use   Vaping status: Never Used  Substance and Sexual Activity   Alcohol  use: Yes    Alcohol /week: 6.0 standard drinks of alcohol     Types: 6 Cans of beer per week   Drug use: No   Sexual activity: Not on file  Other Topics Concern   Not on file  Social History Narrative   Not on file   Social Drivers of Health   Financial Resource Strain: Not on file  Food Insecurity: Not on file  Transportation Needs: Not on file  Physical Activity: Not on file  Stress: Not on file  Social Connections: Not on file  Intimate Partner Violence: Not on file    Review of Systems:  All other review of systems negative except as mentioned in the HPI.  Physical Exam: Vital signs BP (!) 145/89   Pulse 80   Temp 99.6 F (37.6 C) (Temporal)   Ht 5' 10 (1.778 m)   Wt 170 lb (77.1 kg)   SpO2 99%   BMI 24.39 kg/m   General:   Alert,   Well-developed, well-nourished, pleasant and cooperative in NAD Lungs:  Clear throughout to auscultation.   Heart:  Regular rate and rhythm; no murmurs, clicks, rubs,  or gallops. Abdomen:  Soft, nontender and nondistended. Normal bowel sounds.   Neuro/Psych:  Alert and cooperative. Normal mood and affect. A and O x 3   @Gerldine Suleiman  CHARLENA Avram, MD, [facility] [phone] (pager) 02/07/2024 8:48 AM@

## 2024-02-07 ENCOUNTER — Encounter: Payer: Self-pay | Admitting: Internal Medicine

## 2024-02-07 ENCOUNTER — Ambulatory Visit: Admitting: Internal Medicine

## 2024-02-07 VITALS — BP 116/64 | HR 60 | Temp 99.6°F | Resp 13 | Ht 70.0 in | Wt 170.0 lb

## 2024-02-07 DIAGNOSIS — Z860101 Personal history of adenomatous and serrated colon polyps: Secondary | ICD-10-CM | POA: Diagnosis not present

## 2024-02-07 DIAGNOSIS — D128 Benign neoplasm of rectum: Secondary | ICD-10-CM | POA: Diagnosis not present

## 2024-02-07 DIAGNOSIS — Z1211 Encounter for screening for malignant neoplasm of colon: Secondary | ICD-10-CM | POA: Diagnosis not present

## 2024-02-07 DIAGNOSIS — D122 Benign neoplasm of ascending colon: Secondary | ICD-10-CM | POA: Diagnosis not present

## 2024-02-07 DIAGNOSIS — K621 Rectal polyp: Secondary | ICD-10-CM | POA: Diagnosis not present

## 2024-02-07 DIAGNOSIS — I10 Essential (primary) hypertension: Secondary | ICD-10-CM | POA: Diagnosis not present

## 2024-02-07 DIAGNOSIS — K648 Other hemorrhoids: Secondary | ICD-10-CM | POA: Diagnosis not present

## 2024-02-07 DIAGNOSIS — K5730 Diverticulosis of large intestine without perforation or abscess without bleeding: Secondary | ICD-10-CM

## 2024-02-07 DIAGNOSIS — D120 Benign neoplasm of cecum: Secondary | ICD-10-CM

## 2024-02-07 DIAGNOSIS — Z860100 Personal history of colon polyps, unspecified: Secondary | ICD-10-CM

## 2024-02-07 MED ORDER — SODIUM CHLORIDE 0.9 % IV SOLN: 500.0000 mL | INTRAVENOUS | Status: DC

## 2024-02-07 NOTE — Progress Notes (Signed)
 Transferred to PACU via stretcher, arousing, VSS.

## 2024-02-07 NOTE — Progress Notes (Signed)
 Called to room to assist during endoscopic procedure.  Patient ID and intended procedure confirmed with present staff. Received instructions for my participation in the procedure from the performing physician.

## 2024-02-07 NOTE — Patient Instructions (Addendum)
 And removed 4 tiny polyps that look benign.  I will let you know pathology results and when to have another routine colonoscopy by mail and/or My Chart.   You also have a condition called diverticulosis - common and not usually a problem. Please read the handout provided.  Internal hemorrhoids also seen.  I appreciate the opportunity to care for you. Timothy CHARLENA Commander, MD, [facility]   Handouts given: Polyps, Diverticulosis, Hemorrhoids Resume previous diet. Continue present medications.  Await pathology results. Repeat colonoscopy may be recommended for surveillance based on pathology results.  YOU HAD AN ENDOSCOPIC PROCEDURE TODAY AT THE [facility]:   Refer to the procedure report that was given to you for any specific questions about what was found during the examination.  If the procedure report does not answer your questions, please call your gastroenterologist to clarify.  If you requested that your care partner not be given the details of your procedure findings, then the procedure report has been included in a sealed envelope for you to review at your convenience later.  YOU SHOULD EXPECT: Some feelings of bloating in the abdomen. Passage of more gas than usual.  Walking can help get rid of the air that was put into your GI tract during the procedure and reduce the bloating. If you had a lower endoscopy (such as a colonoscopy or flexible sigmoidoscopy) you may notice spotting of blood in your stool or on the toilet paper. If you underwent a bowel prep for your procedure, you may not have a normal bowel movement for a few days.  Please Note:  You might notice some irritation and congestion in your nose or some drainage.  This is from the oxygen used during your procedure.  There is no need for concern and it should clear up in a day or so.  SYMPTOMS TO REPORT IMMEDIATELY:  Following lower endoscopy (colonoscopy or flexible sigmoidoscopy):  Excessive amounts of blood in the  stool  Significant tenderness or worsening of abdominal pains  Swelling of the abdomen that is new, acute  Fever of 100F or higher  For urgent or emergent issues, a gastroenterologist can be reached at any hour by calling [phone]. Do not use MyChart messaging for urgent concerns.    DIET:  We do recommend a small meal at first, but then you may proceed to your regular diet.  Drink plenty of fluids but you should avoid alcoholic beverages for 24 hours.  ACTIVITY:  You should plan to take it easy for the rest of today and you should NOT DRIVE or use heavy machinery until tomorrow (because of the sedation medicines used during the test).    FOLLOW UP: Our staff will call the number listed on your records the next business day following your procedure.  We will call around 7:15- 8:00 am to check on you and address any questions or concerns that you may have regarding the information given to you following your procedure. If we do not reach you, we will leave a message.     If any biopsies were taken you will be contacted by phone or by letter within the next 1-3 weeks.  Please call us  at [phone] if you have not heard about the biopsies in 3 weeks.    SIGNATURES/CONFIDENTIALITY: You and/or your care partner have signed paperwork which will be entered into your electronic medical record.  These signatures attest to the fact that that the information above on your After Visit Summary  has been reviewed and is understood.  Full responsibility of the confidentiality of this discharge information lies with you and/or your care-partner.

## 2024-02-07 NOTE — Op Note (Signed)
 [facility] Patient Name: Timothy [name] Procedure Date: 02/07/2024 8:39 AM MRN: [id number] Endoscopist: Lupita FORBES Commander , MD, [id number] Age: 73 Referring MD:  Date of Birth: 1951-01-31 Gender: Male Account #: [id number] Procedure:                Colonoscopy Indications:              Surveillance: Personal history of adenomatous                            polyps on last colonoscopy > 5 years ago, Last                            colonoscopy: 2015 Medicines:                Monitored Anesthesia Care Procedure:                Pre-Anesthesia Assessment:                           - Prior to the procedure, a History and Physical                            was performed, and patient medications and                            allergies were reviewed. The patient's tolerance of                            previous anesthesia was also reviewed. The risks                            and benefits of the procedure and the sedation                            options and risks were discussed with the patient.                            All questions were answered, and informed consent                            was obtained. Prior Anticoagulants: The patient has                            taken no anticoagulant or antiplatelet agents. ASA                            Grade Assessment: II - A patient with mild systemic                            disease. After reviewing the risks and benefits,                            the patient was deemed in satisfactory condition to  undergo the procedure.                           After obtaining informed consent, the colonoscope                            was passed under direct vision. Throughout the                            procedure, the patient's blood pressure, pulse, and                            oxygen saturations were monitored continuously. The                            CF [id number] #[id number] was introduced through the  anus                            and advanced to the the cecum, identified by                            appendiceal orifice and ileocecal valve. The                            colonoscopy was performed without difficulty. The                            patient tolerated the procedure well. The quality                            of the bowel preparation was good. The ileocecal                            valve, appendiceal orifice, and rectum were                            photographed. The bowel preparation used was                            Miralax via split dose instruction. Scope In: 8:56:27 AM Scope Out: 9:17:14 AM Scope Withdrawal Time: 0 hours 16 minutes 52 seconds  Total Procedure Duration: 0 hours 20 minutes 47 seconds  Findings:                 The perianal and digital rectal examinations were                            normal. Pertinent negatives include normal prostate                            (size, shape, and consistency).                           Four flat and sessile polyps were found in the  rectum, ascending colon and cecum. The polyps were                            2 to 5 mm in size. These polyps were removed with a                            cold snare. Resection and retrieval were complete.                            Verification of patient identification for the                            specimen was done. Estimated blood loss was minimal.                           Multiple diverticula were found in the sigmoid                            colon.                           Internal hemorrhoids were found.                           The exam was otherwise without abnormality on                            direct and retroflexion views. Complications:            No immediate complications. Estimated Blood Loss:     Estimated blood loss was minimal. Impression:               - Four 2 to 5 mm polyps in the rectum, in the                             ascending colon and in the cecum, removed with a                            cold snare. Resected and retrieved.                           - Diverticulosis in the sigmoid colon.                           - Internal hemorrhoids.                           - The examination was otherwise normal on direct                            and retroflexion views.                           - Personal history of colonic polyp- diminutive  adenoma in 2015. Recommendation:           - Patient has a contact number available for                            emergencies. The signs and symptoms of potential                            delayed complications were discussed with the                            patient. Return to normal activities tomorrow.                            Written discharge instructions were provided to the                            patient.                           - Resume previous diet.                           - Continue present medications.                           - Repeat colonoscopy is recommended for                            surveillance. The colonoscopy date will be                            determined after pathology results from today's                            exam become available for review. Lupita FORBES Commander, MD 02/07/2024 9:27:35 AM This report has been signed electronically.

## 2024-02-08 ENCOUNTER — Telehealth: Payer: Self-pay

## 2024-02-08 NOTE — Telephone Encounter (Signed)
  Follow up Call-     02/07/2024    7:31 AM  Call back number  Post procedure Call Back phone  # [phone]  Permission to leave phone message Yes     Patient questions:  Do you have a fever, pain , or abdominal swelling? No. Pain Score  0 *  Have you tolerated food without any problems? Yes.    Have you been able to return to your normal activities? Yes.    Do you have any questions about your discharge instructions: Diet   No. Medications  No. Follow up visit  No.  Do you have questions or concerns about your Care? No.  Actions: * If pain score is 4 or above: No action needed, pain <4.

## 2024-02-11 LAB — SURGICAL PATHOLOGY

## 2024-02-15 ENCOUNTER — Ambulatory Visit: Payer: Self-pay | Admitting: Internal Medicine

## 2024-02-15 DIAGNOSIS — Z860101 Personal history of adenomatous and serrated colon polyps: Secondary | ICD-10-CM

## 2024-03-10 ENCOUNTER — Other Ambulatory Visit: Payer: Self-pay | Admitting: Internal Medicine

## 2024-04-08 ENCOUNTER — Other Ambulatory Visit: Payer: Self-pay | Admitting: Internal Medicine
